# Patient Record
Sex: Male | Born: 1950 | Race: White | Hispanic: No | Marital: Married | Smoking: Former smoker
Health system: Southern US, Community
[De-identification: ages and names within clinical notes are randomized; demographics above are authoritative.]

## PROBLEM LIST (undated history)

## (undated) DIAGNOSIS — H9311 Tinnitus, right ear: Secondary | ICD-10-CM

## (undated) DIAGNOSIS — K219 Gastro-esophageal reflux disease without esophagitis: Secondary | ICD-10-CM

## (undated) DIAGNOSIS — D369 Benign neoplasm, unspecified site: Secondary | ICD-10-CM

## (undated) DIAGNOSIS — N529 Male erectile dysfunction, unspecified: Secondary | ICD-10-CM

## (undated) DIAGNOSIS — J310 Chronic rhinitis: Secondary | ICD-10-CM

## (undated) DIAGNOSIS — F329 Major depressive disorder, single episode, unspecified: Secondary | ICD-10-CM

## (undated) DIAGNOSIS — M5412 Radiculopathy, cervical region: Secondary | ICD-10-CM

## (undated) DIAGNOSIS — C61 Malignant neoplasm of prostate: Secondary | ICD-10-CM

## (undated) DIAGNOSIS — IMO0002 Reserved for concepts with insufficient information to code with codable children: Secondary | ICD-10-CM

## (undated) DIAGNOSIS — F419 Anxiety disorder, unspecified: Secondary | ICD-10-CM

## (undated) DIAGNOSIS — F32A Depression, unspecified: Secondary | ICD-10-CM

## (undated) DIAGNOSIS — K635 Polyp of colon: Secondary | ICD-10-CM

## (undated) HISTORY — DX: Benign neoplasm, unspecified site: D36.9

## (undated) HISTORY — DX: Reserved for concepts with insufficient information to code with codable children: IMO0002

## (undated) HISTORY — DX: Chronic rhinitis: J31.0

## (undated) HISTORY — DX: Radiculopathy, cervical region: M54.12

## (undated) HISTORY — DX: Male erectile dysfunction, unspecified: N52.9

## (undated) HISTORY — PX: OTHER SURGICAL HISTORY: SHX169

---

## 1999-09-26 ENCOUNTER — Ambulatory Visit (HOSPITAL_COMMUNITY): Admission: RE | Admit: 1999-09-26 | Discharge: 1999-09-26 | Payer: Self-pay | Admitting: Neurological Surgery

## 1999-09-26 ENCOUNTER — Encounter: Payer: Self-pay | Admitting: Neurological Surgery

## 1999-10-17 ENCOUNTER — Ambulatory Visit (HOSPITAL_COMMUNITY): Admission: RE | Admit: 1999-10-17 | Discharge: 1999-10-17 | Payer: Self-pay | Admitting: Neurological Surgery

## 1999-10-17 ENCOUNTER — Encounter: Payer: Self-pay | Admitting: Neurological Surgery

## 2003-11-09 DIAGNOSIS — D369 Benign neoplasm, unspecified site: Secondary | ICD-10-CM

## 2003-11-09 HISTORY — DX: Benign neoplasm, unspecified site: D36.9

## 2004-08-10 ENCOUNTER — Ambulatory Visit (HOSPITAL_COMMUNITY): Admission: RE | Admit: 2004-08-10 | Discharge: 2004-08-10 | Payer: Self-pay | Admitting: Gastroenterology

## 2004-08-10 ENCOUNTER — Encounter (INDEPENDENT_AMBULATORY_CARE_PROVIDER_SITE_OTHER): Payer: Self-pay | Admitting: *Deleted

## 2006-11-08 HISTORY — PX: OTHER SURGICAL HISTORY: SHX169

## 2009-11-08 DIAGNOSIS — D369 Benign neoplasm, unspecified site: Secondary | ICD-10-CM

## 2009-11-08 HISTORY — DX: Benign neoplasm, unspecified site: D36.9

## 2010-03-12 ENCOUNTER — Ambulatory Visit (HOSPITAL_COMMUNITY): Admission: RE | Admit: 2010-03-12 | Discharge: 2010-03-12 | Payer: Self-pay | Admitting: Gastroenterology

## 2011-03-26 NOTE — Op Note (Signed)
NAME:  Joseph Hudson, Joseph Hudson NO.:  [id number]   MEDICAL RECORD NO.:  [id number]          PATIENT TYPE:  AMB   LOCATION:  ENDO                         FACILITY:  [facility]   PHYSICIAN:  Danise Edge, M.D.   DATE OF BIRTH:  Nov 22, 1950   DATE OF PROCEDURE:  08/10/2004  DATE OF DISCHARGE:                                 OPERATIVE REPORT   INDICATIONS:  Mr. Lucion Dilger is a 60 year old male born September 25, 1951. Mr. Pop is scheduled to undergo his first screening colonoscopy  with polypectomy to prevent colon cancer.   ENDOSCOPIST:  Danise Edge, M.D.   PREMEDICATION:  Versed 7.5 mg, Demerol 50 mg.   PROCEDURE NOTE:  After obtaining informed consent, Mr. Walkowski was placed in  the left lateral decubitus position. I administered intravenous Demerol and  intravenous Versed to achieve conscious sedation for the procedure. The  patient's blood pressure, oxygen saturation, and cardiac rhythm were  monitored throughout the procedure and documented in the medical record.   Anal inspection and digital rectal exam were normal. The prostate was  nonnodular. The Olympus adjustable pediatric colonoscope was introduced into  the rectum and advanced to the cecum. Colonic preparation for the exam today  was excellent.   Rectum:  Normal.   Sigmoid colon and descending colon:  Normal.   Splenic flexure:  Normal.   Transverse colon:  Normal.   Hepatic flexure:  Normal.   Ascending colon:  In the proximal ascending colon, a 3-mm sessile polyp was  removed with electrocautery snare.   Cecum and ileocecal valve:  Normal.   ASSESSMENT:  A small polyp was removed from the proximal ascending colon  with the electrocautery snare and submitted for pathologic interpretation.      MJ/MEDQ  D:  08/10/2004  T:  08/10/2004  Job:  [id number]   cc:   Theressa Millard, M.D.  [address] [place]  [place] [address]  Fax: [phone]

## 2011-04-27 LAB — BASIC METABOLIC PANEL
BUN: 15 (ref 4–21)
Creatinine: 1.1 (ref 0.6–1.3)
Glucose: 79
Potassium: 4.1 (ref 3.4–5.3)
SODIUM: 138 (ref 137–147)

## 2011-04-27 LAB — HEPATIC FUNCTION PANEL
ALK PHOS: 51 (ref 25–125)
ALT: 18 (ref 10–40)
AST: 26 (ref 14–40)
BILIRUBIN, TOTAL: 0.4

## 2011-04-27 LAB — PSA: PSA: 5.08

## 2011-04-27 LAB — VITAMIN B12: Vitamin B-12: 398

## 2011-08-13 ENCOUNTER — Other Ambulatory Visit: Payer: Self-pay | Admitting: Urology

## 2011-08-13 ENCOUNTER — Ambulatory Visit (HOSPITAL_BASED_OUTPATIENT_CLINIC_OR_DEPARTMENT_OTHER)
Admission: RE | Admit: 2011-08-13 | Discharge: 2011-08-13 | Disposition: A | Discharge status: Home / Self Care | Payer: BC Managed Care – PPO | Source: Ambulatory Visit | Attending: Urology | Admitting: Urology

## 2011-08-13 DIAGNOSIS — Z01810 Encounter for preprocedural cardiovascular examination: Secondary | ICD-10-CM | POA: Insufficient documentation

## 2011-08-13 DIAGNOSIS — Z01812 Encounter for preprocedural laboratory examination: Secondary | ICD-10-CM | POA: Insufficient documentation

## 2011-08-13 DIAGNOSIS — C61 Malignant neoplasm of prostate: Secondary | ICD-10-CM

## 2011-08-13 DIAGNOSIS — R972 Elevated prostate specific antigen [PSA]: Secondary | ICD-10-CM | POA: Insufficient documentation

## 2011-08-13 DIAGNOSIS — K219 Gastro-esophageal reflux disease without esophagitis: Secondary | ICD-10-CM | POA: Insufficient documentation

## 2011-08-13 HISTORY — DX: Malignant neoplasm of prostate: C61

## 2011-08-13 LAB — POCT HEMOGLOBIN-HEMACUE: Hemoglobin: 15.2 g/dL (ref 13.0–17.0)

## 2011-08-17 NOTE — Op Note (Signed)
  NAMEMESHULEM, ONORATO NO.:  [id number]  MEDICAL RECORD NO.:  [id number]  LOCATION:                                 FACILITY:  PHYSICIAN:  Danae Chen, M.D.       DATE OF BIRTH:  DATE OF PROCEDURE:  08/13/2011 DATE OF DISCHARGE:                              OPERATIVE REPORT   PREOPERATIVE DIAGNOSIS:  Elevated prostate specific antigen.  POSTOPERATIVE DIAGNOSIS:  Elevated prostate specific antigen.  PROCEDURE DONE:  Ultrasound, prostate biopsy.  SURGEON:  Danae Chen, M.D.  ULTRASOUND TECHNOLOGIST:  Dorcas Mcmurray.  INDICATION:  The patient is a 60 year old male, who has an elevated PSA of 5.08.  His PSA was 1.46 in May 2011.  He is scheduled today for ultrasound prostate biopsy.  He preferred to have the procedure done under general anesthesia.  The patient was identified by his wristband and proper time-out was taken.  Under general anesthesia, he was prepped and draped and placed in the left lateral decubitus position.  The transducer was then inserted into the rectum.  An ultrasound of the prostate was done.  The seminal vesicles appear normal.  There are several calcifications in the mid gland.  The prostate volume is 26.58 mL.  The prostate width is 4.62 cm, the height is 2.73 cm and the length is 4.02 cm.  Two biopsies of the right base, two biopsies of the right main gland and two biopsies of the right apex were done.  Then two biopsies of the left base, left mid gland, and left apex were done.  The transducer was then removed.  There was no evidence of bleeding at the end of the procedure.  The patient tolerated the procedure well and left the OR in satisfactory condition to post anesthesia care unit.     Danae Chen, M.D.     MN/MEDQ  D:  08/13/2011  T:  08/13/2011  Job:  [id number]  cc:   Theressa Millard, M.D. Fax: [phone]  Electronically Signed by Lindaann Slough M.D. on 08/17/2011 10:08:26 AM

## 2011-12-23 ENCOUNTER — Other Ambulatory Visit: Payer: Self-pay | Admitting: Urology

## 2012-01-05 ENCOUNTER — Encounter (HOSPITAL_COMMUNITY): Payer: Self-pay | Admitting: Pharmacy Technician

## 2012-01-06 ENCOUNTER — Encounter (HOSPITAL_COMMUNITY): Payer: Self-pay

## 2012-01-06 ENCOUNTER — Ambulatory Visit (HOSPITAL_COMMUNITY)
Admission: RE | Admit: 2012-01-06 | Discharge: 2012-01-06 | Disposition: A | Discharge status: Home / Self Care | Payer: BC Managed Care – PPO | Source: Ambulatory Visit | Attending: Urology | Admitting: Urology

## 2012-01-06 ENCOUNTER — Encounter (HOSPITAL_COMMUNITY)
Admission: RE | Admit: 2012-01-06 | Discharge: 2012-01-06 | Disposition: A | Discharge status: Home / Self Care | Payer: BC Managed Care – PPO | Source: Ambulatory Visit | Attending: Urology | Admitting: Urology

## 2012-01-06 ENCOUNTER — Other Ambulatory Visit: Payer: Self-pay

## 2012-01-06 DIAGNOSIS — Z01810 Encounter for preprocedural cardiovascular examination: Secondary | ICD-10-CM | POA: Insufficient documentation

## 2012-01-06 DIAGNOSIS — C61 Malignant neoplasm of prostate: Secondary | ICD-10-CM | POA: Insufficient documentation

## 2012-01-06 DIAGNOSIS — Z79899 Other long term (current) drug therapy: Secondary | ICD-10-CM | POA: Insufficient documentation

## 2012-01-06 DIAGNOSIS — K219 Gastro-esophageal reflux disease without esophagitis: Secondary | ICD-10-CM | POA: Insufficient documentation

## 2012-01-06 DIAGNOSIS — Z01818 Encounter for other preprocedural examination: Secondary | ICD-10-CM | POA: Insufficient documentation

## 2012-01-06 DIAGNOSIS — Z01812 Encounter for preprocedural laboratory examination: Secondary | ICD-10-CM | POA: Insufficient documentation

## 2012-01-06 HISTORY — DX: Malignant neoplasm of prostate: C61

## 2012-01-06 HISTORY — DX: Tinnitus, right ear: H93.11

## 2012-01-06 HISTORY — DX: Polyp of colon: K63.5

## 2012-01-06 HISTORY — DX: Gastro-esophageal reflux disease without esophagitis: K21.9

## 2012-01-06 HISTORY — DX: Anxiety disorder, unspecified: F41.9

## 2012-01-06 LAB — CBC
HCT: 43 % (ref 39.0–52.0)
Hemoglobin: 14.7 g/dL (ref 13.0–17.0)
MCH: 32.4 pg (ref 26.0–34.0)
MCHC: 34.2 g/dL (ref 30.0–36.0)
MCV: 94.7 fL (ref 78.0–100.0)
RBC: 4.54 MIL/uL (ref 4.22–5.81)

## 2012-01-06 LAB — BASIC METABOLIC PANEL
BUN: 15 mg/dL (ref 6–23)
CO2: 30 mEq/L (ref 19–32)
Calcium: 9.7 mg/dL (ref 8.4–10.5)
Creatinine, Ser: 1.02 mg/dL (ref 0.50–1.35)
GFR calc non Af Amer: 79 mL/min — ABNORMAL LOW (ref >90)
Glucose, Bld: 81 mg/dL (ref 70–99)
Sodium: 137 mEq/L (ref 135–145)

## 2012-01-06 LAB — SURGICAL PCR SCREEN: MRSA, PCR: NEGATIVE

## 2012-01-06 NOTE — Patient Instructions (Signed)
[name]  01/06/2012   Your procedure is scheduled on:  01-13-12  Report to [facility] at  0830 AM.  Call this number if you have problems the morning of surgery: [phone]   Remember:   Clear liquids day before surgery and bowel prep per dr Laverle Patter instructions.  .  Take these medicines the morning of surgery with A SIP OF WATER: astelin nasal spray, pantaprazole, flucitasone nasal spray, certizine   Do not wear jewelry or make up.  Do not wear lotions, powders, or perfumes.Do not wear deodorant.    Do not bring valuables to the hospital.  Contacts, dentures or bridgework may not be worn into surgery.  Leave suitcase in the car. After surgery it may be brought to your room.  For patients admitted to the hospital, checkout time is 11:00 AM the day of discharge.     Special Instructions: CHG Shower Use Special Wash: 1/2 bottle night before surgery and 1/2 bottle morning of surgery.neck down avoid private area   Please read over the following fact sheets that you were given: MRSA Information, blood fact sheet  [facility] pre op nurse phone number [phone], call if needed

## 2012-01-06 NOTE — Pre-Procedure Instructions (Signed)
Spoke with sonia mcdougle will make dr borden aware of chest xray results done 01-06-2012

## 2012-01-07 ENCOUNTER — Ambulatory Visit (HOSPITAL_COMMUNITY)
Admission: RE | Admit: 2012-01-07 | Discharge: 2012-01-07 | Disposition: A | Discharge status: Home / Self Care | Payer: BC Managed Care – PPO | Source: Ambulatory Visit | Attending: Urology | Admitting: Urology

## 2012-01-07 ENCOUNTER — Other Ambulatory Visit (HOSPITAL_COMMUNITY): Payer: Self-pay | Admitting: Urology

## 2012-01-07 DIAGNOSIS — R911 Solitary pulmonary nodule: Secondary | ICD-10-CM

## 2012-01-12 NOTE — H&P (Signed)
Chief Complaint  Prostate Cancer     History of Present Illness     [name] is a 87 year who was found to have an elevated PSA of 5.08 prompting urologic evaluation by Dr. Brunilda Payor.  He was found to have an indurated prostate all along the left lobe of the prostate.  He underwent a prostate biopsy in the OR on 08/13/11 which demonstrated 5 out of 12 biopsy cores positive for Gleason 3+3=6 adenocarcinoma. Unfortunately, his pathology report does not specify the location of the positive cores. He has no family history of prostate cancer.  He is well informed about his treatment options.  TNM stage: cT2b Nx Mx PSA: 5.08 Gleason score: 3+3=6 Biopsy (08/13/11, read by Dr. Italy Rund, [facility], Acc # [id number]): 5/12 cores positive I did call [facility] pathology today and confirmed that all of his biopsy cores were positive were located on the left side of the prostate with 60% involvement of the left lateral apex, left apex, and left lateral mid. 1% of the left mid. 25% of the left lateral base. Prostate volume: 26.6 cc  Nomogram: OC disease: 71% EPE: 36% SVI: 3% LNI: 2.6% PFS (surgery): 95%, 92%  Urinary function: He does have moderate lower urinary tract symptoms. His most significant symptoms include incomplete emptying and a weak stream. IPSS: 14/4.  Erectile function: He denies erectile dysfunction. SHIM score: 25   Past Medical History Problems  1. History of  Allergic Rhinitis 477.9 2. History of  Anxiety (Symptom) 300.00 3. History of  Heartburn 787.1 4. History of  Peptic Ulcer V12.71  Surgical History Problems  1. History of  Biopsy Of The Prostate Needle 2. History of  Shoulder Surgery Right  Current Meds 1. Multi-Vitamin TABS; Therapy: (Recorded:29Aug2012) to 2. Nasonex SUSP; Therapy: (Recorded:29Aug2012) to 3. Protonix 20 MG Oral Tablet Delayed Release; Therapy: (Recorded:29Aug2012) to 4. ZyrTEC Allergy TABS; Therapy: (Recorded:29Aug2012)  to  Allergies Medication  1. No Known Drug Allergies  Family History Problems  1. Family history of  Heart Disease V17.49 Denied  2. Family history of  Prostate Cancer  Social History Problems    Alcohol Use 2   Marital History - Currently Married   Tobacco Use 305.1 smoked 1 1/2 pk for 25 yrs / quit18 yrs  Review of Systems Constitutional, skin, eye, otolaryngeal, hematologic/lymphatic, cardiovascular, pulmonary, endocrine, musculoskeletal, gastrointestinal, neurological and psychiatric system(s) were reviewed and pertinent findings if present are noted.    Vitals  BMI Calculated: 25.43 BSA Calculated: 1.85 Height: 5 ft 7 in Weight: 162 lb    Physical Exam Constitutional: Well nourished and well developed . No acute distress.  ENT:. The ears and nose are normal in appearance.  Neck: The appearance of the neck is normal and no neck mass is present.  Pulmonary: No respiratory distress, normal respiratory rhythm and effort and clear bilateral breath sounds.  Cardiovascular: Heart rate and rhythm are normal . No peripheral edema.  Abdomen: The abdomen is soft and nontender. No masses are palpated. No CVA tenderness. No hernias are palpable. No hepatosplenomegaly noted.  Rectal: Rectal exam demonstrates normal sphincter tone, no tenderness and no masses. Prostate size is estimated to be 35 g. He does have significant induration all along the lateral aspect of the left side of the prostate which is concerning for malignancy and possible but not definite extraprostatic extension. The prostate is not tender. The left seminal vesicle is nonpalpable. The right seminal vesicle is nonpalpable. The perineum is normal on inspection.  Lymphatics: The femoral and inguinal nodes are not enlarged or tender.  Skin: Normal skin turgor, no visible rash and no visible skin lesions.  Neuro/Psych:. Mood and affect are appropriate.    Results/Data  I have independently reviewed his medical  records, PSA results, and pathology report.     Assessment Assessed  1. Prostate Cancer 185  Discussion/Summary  1. Prostate cancer: My plan is to perform a right nerve sparing robotic prostatectomy and pelvic lymphadenectomy considering the concern regarding the disease on the left side of the prostate.

## 2012-01-13 ENCOUNTER — Inpatient Hospital Stay (HOSPITAL_COMMUNITY)
Admission: RE | Admit: 2012-01-13 | Discharge: 2012-01-14 | DRG: 335 | Disposition: A | Discharge status: Home / Self Care | Payer: BC Managed Care – PPO | Source: Ambulatory Visit | Attending: Urology | Admitting: Urology

## 2012-01-13 ENCOUNTER — Encounter (HOSPITAL_COMMUNITY): Payer: Self-pay | Admitting: *Deleted

## 2012-01-13 ENCOUNTER — Ambulatory Visit (HOSPITAL_COMMUNITY): Payer: BC Managed Care – PPO | Admitting: Anesthesiology

## 2012-01-13 ENCOUNTER — Encounter (HOSPITAL_COMMUNITY): Payer: Self-pay | Admitting: Anesthesiology

## 2012-01-13 ENCOUNTER — Encounter (HOSPITAL_COMMUNITY)
Admission: RE | Disposition: A | Discharge status: Home / Self Care | Payer: Self-pay | Source: Ambulatory Visit | Attending: Urology

## 2012-01-13 DIAGNOSIS — J309 Allergic rhinitis, unspecified: Secondary | ICD-10-CM | POA: Diagnosis present

## 2012-01-13 DIAGNOSIS — Z87891 Personal history of nicotine dependence: Secondary | ICD-10-CM

## 2012-01-13 DIAGNOSIS — C61 Malignant neoplasm of prostate: Principal | ICD-10-CM | POA: Diagnosis present

## 2012-01-13 DIAGNOSIS — Z8711 Personal history of peptic ulcer disease: Secondary | ICD-10-CM

## 2012-01-13 HISTORY — PX: ROBOT ASSISTED LAPAROSCOPIC RADICAL PROSTATECTOMY: SHX5141

## 2012-01-13 LAB — TYPE AND SCREEN: Antibody Screen: NEGATIVE

## 2012-01-13 LAB — HEMOGLOBIN AND HEMATOCRIT, BLOOD
HCT: 41.3 % (ref 39.0–52.0)
Hemoglobin: 14 g/dL (ref 13.0–17.0)

## 2012-01-13 SURGERY — ROBOTIC ASSISTED LAPAROSCOPIC RADICAL PROSTATECTOMY LEVEL 2
Anesthesia: General | Site: Abdomen | Wound class: Clean Contaminated

## 2012-01-13 MED ORDER — ACETAMINOPHEN 10 MG/ML IV SOLN
1000.0000 mg | Freq: Four times a day (QID) | INTRAVENOUS | Status: DC
  Administered 2012-01-13 – 2012-01-14 (×3): 1000 mg via INTRAVENOUS
  Filled 2012-01-13 (×4): qty 100

## 2012-01-13 MED ORDER — CIPROFLOXACIN HCL 500 MG PO TABS: 500.0000 mg | ORAL_TABLET | Freq: Two times a day (BID) | ORAL | Status: AC

## 2012-01-13 MED ORDER — MEPERIDINE HCL 50 MG/ML IJ SOLN
INTRAMUSCULAR | Status: AC
  Filled 2012-01-13: qty 1

## 2012-01-13 MED ORDER — BUPIVACAINE-EPINEPHRINE 0.25% -1:200000 IJ SOLN
INTRAMUSCULAR | Status: DC | PRN
  Administered 2012-01-13: 27 mL

## 2012-01-13 MED ORDER — HYDROCODONE-ACETAMINOPHEN 5-325 MG PO TABS: 1.0000 | ORAL_TABLET | Freq: Four times a day (QID) | ORAL | Status: AC | PRN

## 2012-01-13 MED ORDER — SUFENTANIL CITRATE 50 MCG/ML IV SOLN
INTRAVENOUS | Status: DC | PRN
  Administered 2012-01-13: 5 ug via INTRAVENOUS
  Administered 2012-01-13 (×4): 10 ug via INTRAVENOUS
  Administered 2012-01-13: 15 ug via INTRAVENOUS
  Administered 2012-01-13: 10 ug via INTRAVENOUS

## 2012-01-13 MED ORDER — MORPHINE SULFATE 2 MG/ML IJ SOLN
2.0000 mg | INTRAMUSCULAR | Status: DC | PRN
  Administered 2012-01-13 – 2012-01-14 (×2): 2 mg via INTRAVENOUS
  Filled 2012-01-13 (×2): qty 1

## 2012-01-13 MED ORDER — LACTATED RINGERS IV SOLN: INTRAVENOUS | Status: DC

## 2012-01-13 MED ORDER — DIPHENHYDRAMINE HCL 50 MG/ML IJ SOLN: 12.5000 mg | Freq: Four times a day (QID) | INTRAMUSCULAR | Status: DC | PRN

## 2012-01-13 MED ORDER — PROMETHAZINE HCL 25 MG/ML IJ SOLN: 6.2500 mg | INTRAMUSCULAR | Status: DC | PRN

## 2012-01-13 MED ORDER — CEFAZOLIN SODIUM 1-5 GM-% IV SOLN
1.0000 g | Freq: Three times a day (TID) | INTRAVENOUS | Status: AC
  Administered 2012-01-13 – 2012-01-14 (×2): 1 g via INTRAVENOUS
  Filled 2012-01-13 (×2): qty 50

## 2012-01-13 MED ORDER — SUCCINYLCHOLINE CHLORIDE 20 MG/ML IJ SOLN
INTRAMUSCULAR | Status: DC | PRN
  Administered 2012-01-13: 80 mg via INTRAVENOUS

## 2012-01-13 MED ORDER — INDIGOTINDISULFONATE SODIUM 8 MG/ML IJ SOLN
INTRAMUSCULAR | Status: DC | PRN
  Administered 2012-01-13 (×2): 5 mL via INTRAVENOUS

## 2012-01-13 MED ORDER — DOCUSATE SODIUM 100 MG PO CAPS
100.0000 mg | ORAL_CAPSULE | Freq: Two times a day (BID) | ORAL | Status: DC
  Administered 2012-01-13 – 2012-01-14 (×2): 100 mg via ORAL
  Filled 2012-01-13 (×3): qty 1

## 2012-01-13 MED ORDER — ACETAMINOPHEN 10 MG/ML IV SOLN
INTRAVENOUS | Status: AC
  Filled 2012-01-13: qty 100

## 2012-01-13 MED ORDER — BUPIVACAINE-EPINEPHRINE PF 0.25-1:200000 % IJ SOLN
INTRAMUSCULAR | Status: AC
  Filled 2012-01-13: qty 30

## 2012-01-13 MED ORDER — ACETAMINOPHEN 10 MG/ML IV SOLN
INTRAVENOUS | Status: DC | PRN
  Administered 2012-01-13: 1000 mg via INTRAVENOUS

## 2012-01-13 MED ORDER — DIPHENHYDRAMINE HCL 12.5 MG/5ML PO ELIX: 12.5000 mg | ORAL_SOLUTION | Freq: Four times a day (QID) | ORAL | Status: DC | PRN

## 2012-01-13 MED ORDER — NEOSTIGMINE METHYLSULFATE 1 MG/ML IJ SOLN
INTRAMUSCULAR | Status: DC | PRN
  Administered 2012-01-13: 4 mg via INTRAVENOUS

## 2012-01-13 MED ORDER — ONDANSETRON HCL 4 MG/2ML IJ SOLN
INTRAMUSCULAR | Status: DC | PRN
  Administered 2012-01-13: 4 mg via INTRAVENOUS

## 2012-01-13 MED ORDER — STERILE WATER FOR IRRIGATION IR SOLN
Status: DC | PRN
  Administered 2012-01-13: 3000 mL

## 2012-01-13 MED ORDER — LACTATED RINGERS IV SOLN
INTRAVENOUS | Status: DC | PRN
  Administered 2012-01-13: 12:00:00

## 2012-01-13 MED ORDER — FLUTICASONE PROPIONATE 50 MCG/ACT NA SUSP
1.0000 | Freq: Every day | NASAL | Status: DC
  Administered 2012-01-13 – 2012-01-14 (×2): 1 via NASAL
  Filled 2012-01-13: qty 16

## 2012-01-13 MED ORDER — INDIGOTINDISULFONATE SODIUM 8 MG/ML IJ SOLN
INTRAMUSCULAR | Status: AC
  Filled 2012-01-13: qty 10

## 2012-01-13 MED ORDER — PROPOFOL 10 MG/ML IV EMUL
INTRAVENOUS | Status: DC | PRN
  Administered 2012-01-13: 150 mg via INTRAVENOUS

## 2012-01-13 MED ORDER — SODIUM CHLORIDE 0.9 % IR SOLN
Status: DC | PRN
  Administered 2012-01-13: 250 mL

## 2012-01-13 MED ORDER — DEXAMETHASONE SODIUM PHOSPHATE 4 MG/ML IJ SOLN
INTRAMUSCULAR | Status: DC | PRN
  Administered 2012-01-13: 8 mg via INTRAVENOUS

## 2012-01-13 MED ORDER — KCL IN DEXTROSE-NACL 20-5-0.45 MEQ/L-%-% IV SOLN
INTRAVENOUS | Status: DC
  Administered 2012-01-13: 18:00:00 via INTRAVENOUS
  Filled 2012-01-13 (×5): qty 1000

## 2012-01-13 MED ORDER — GLYCOPYRROLATE 0.2 MG/ML IJ SOLN
INTRAMUSCULAR | Status: DC | PRN
  Administered 2012-01-13: .6 mg via INTRAVENOUS

## 2012-01-13 MED ORDER — SODIUM CHLORIDE 0.9 % IV BOLUS (SEPSIS)
1000.0000 mL | Freq: Once | INTRAVENOUS | Status: AC
  Administered 2012-01-13: 1000 mL via INTRAVENOUS

## 2012-01-13 MED ORDER — CEFAZOLIN SODIUM 1-5 GM-% IV SOLN
INTRAVENOUS | Status: AC
  Filled 2012-01-13: qty 50

## 2012-01-13 MED ORDER — HYDROMORPHONE HCL PF 1 MG/ML IJ SOLN: 0.2500 mg | INTRAMUSCULAR | Status: DC | PRN

## 2012-01-13 MED ORDER — PANTOPRAZOLE SODIUM 40 MG PO TBEC
40.0000 mg | DELAYED_RELEASE_TABLET | Freq: Every morning | ORAL | Status: DC
  Filled 2012-01-13: qty 1

## 2012-01-13 MED ORDER — AZELASTINE HCL 0.1 % NA SOLN
1.0000 | Freq: Two times a day (BID) | NASAL | Status: DC
  Administered 2012-01-13 – 2012-01-14 (×2): 1 via NASAL
  Filled 2012-01-13: qty 30

## 2012-01-13 MED ORDER — MIDAZOLAM HCL 5 MG/5ML IJ SOLN
INTRAMUSCULAR | Status: DC | PRN
  Administered 2012-01-13: 2 mg via INTRAVENOUS

## 2012-01-13 MED ORDER — LACTATED RINGERS IV SOLN
INTRAVENOUS | Status: DC | PRN
  Administered 2012-01-13 (×3): via INTRAVENOUS

## 2012-01-13 MED ORDER — HEPARIN SODIUM (PORCINE) 1000 UNIT/ML IJ SOLN
INTRAMUSCULAR | Status: AC
  Filled 2012-01-13: qty 1

## 2012-01-13 MED ORDER — CEFAZOLIN SODIUM 1-5 GM-% IV SOLN
1.0000 g | INTRAVENOUS | Status: AC
  Administered 2012-01-13: 1 g via INTRAVENOUS

## 2012-01-13 MED ORDER — CISATRACURIUM BESYLATE 2 MG/ML IV SOLN
INTRAVENOUS | Status: DC | PRN
  Administered 2012-01-13: 10 mg via INTRAVENOUS
  Administered 2012-01-13 (×2): 4 mg via INTRAVENOUS

## 2012-01-13 MED ORDER — MEPERIDINE HCL 50 MG/ML IJ SOLN
6.2500 mg | INTRAMUSCULAR | Status: DC | PRN
  Administered 2012-01-13: 12.5 mg via INTRAVENOUS

## 2012-01-13 MED ORDER — LIDOCAINE HCL (CARDIAC) 20 MG/ML IV SOLN
INTRAVENOUS | Status: DC | PRN
  Administered 2012-01-13: 100 mg via INTRAVENOUS

## 2012-01-13 SURGICAL SUPPLY — 37 items
CANISTER SUCTION 2500CC (MISCELLANEOUS) ×3 IMPLANT
CATH ROBINSON RED A/P 8FR (CATHETERS) ×3 IMPLANT
CHLORAPREP W/TINT 26ML (MISCELLANEOUS) ×6 IMPLANT
CLIP LIGATING HEM O LOK PURPLE (MISCELLANEOUS) ×6 IMPLANT
CLOTH BEACON ORANGE TIMEOUT ST (SAFETY) ×3 IMPLANT
CORD HIGH FREQUENCY UNIPOLAR (ELECTROSURGICAL) ×3 IMPLANT
COVER SURGICAL LIGHT HANDLE (MISCELLANEOUS) ×3 IMPLANT
COVER TIP SHEARS 8 DVNC (MISCELLANEOUS) ×2 IMPLANT
COVER TIP SHEARS 8MM DA VINCI (MISCELLANEOUS) ×1
CUTTER ECHEON FLEX ENDO 45 340 (ENDOMECHANICALS) ×3 IMPLANT
DECANTER SPIKE VIAL GLASS SM (MISCELLANEOUS) IMPLANT
DRAPE SURG IRRIG POUCH 19X23 (DRAPES) ×3 IMPLANT
DRSG TEGADERM 2-3/8X2-3/4 SM (GAUZE/BANDAGES/DRESSINGS) ×12 IMPLANT
DRSG TEGADERM 4X4.75 (GAUZE/BANDAGES/DRESSINGS) ×3 IMPLANT
DRSG TEGADERM 6X8 (GAUZE/BANDAGES/DRESSINGS) IMPLANT
ELECT REM PT RETURN 9FT ADLT (ELECTROSURGICAL) ×3
ELECTRODE REM PT RTRN 9FT ADLT (ELECTROSURGICAL) ×2 IMPLANT
GLOVE BIO SURGEON STRL SZ 6.5 (GLOVE) ×3 IMPLANT
GLOVE BIOGEL M STRL SZ7.5 (GLOVE) ×6 IMPLANT
GOWN STRL NON-REIN LRG LVL3 (GOWN DISPOSABLE) ×9 IMPLANT
GOWN STRL REIN XL XLG (GOWN DISPOSABLE) ×6 IMPLANT
HOLDER FOLEY CATH W/STRAP (MISCELLANEOUS) ×3 IMPLANT
IV LACTATED RINGERS 1000ML (IV SOLUTION) ×3 IMPLANT
KIT ACCESSORY DA VINCI DISP (KITS) ×1
KIT ACCESSORY DVNC DISP (KITS) ×2 IMPLANT
NDL SAFETY ECLIPSE 18X1.5 (NEEDLE) ×2 IMPLANT
NEEDLE HYPO 18GX1.5 SHARP (NEEDLE) ×1
PACK ROBOT UROLOGY CUSTOM (CUSTOM PROCEDURE TRAY) ×3 IMPLANT
RELOAD GREEN ECHELON 45 (STAPLE) ×3 IMPLANT
SET TUBE IRRIG SUCTION NO TIP (IRRIGATION / IRRIGATOR) ×3 IMPLANT
SOLUTION ELECTROLUBE (MISCELLANEOUS) ×3 IMPLANT
SPONGE GAUZE 4X4 12PLY (GAUZE/BANDAGES/DRESSINGS) ×3 IMPLANT
SUT VICRYL 0 UR6 27IN ABS (SUTURE) ×9 IMPLANT
SYR 27GX1/2 1ML LL SAFETY (SYRINGE) ×3 IMPLANT
TOWEL OR 17X26 10 PK STRL BLUE (TOWEL DISPOSABLE) ×3 IMPLANT
TOWEL OR NON WOVEN STRL DISP B (DISPOSABLE) ×3 IMPLANT
WATER STERILE IRR 1500ML POUR (IV SOLUTION) ×6 IMPLANT

## 2012-01-13 NOTE — Discharge Instructions (Signed)
1. Activity:  You are encouraged to ambulate frequently (about every hour during waking hours) to help prevent blood clots from forming in your legs or lungs.  However, you should not engage in any heavy lifting (> 10-15 lbs), strenuous activity, or straining. °2. Diet: You should continue a clear liquid diet until passing gas from below.  Once this occurs, you may advance your diet to a soft diet that would be easy to digest (i.e soups, scrambled eggs, mashed potatoes, etc.) for 24 hours just as you would if getting over a bad stomach flu.  If tolerating this diet well for 24 hours, you may then begin eating regular food.  It will be normal to have some amount of bloating, nausea, and abdominal discomfort intermittently. °3. Prescriptions:  You will be provided a prescription for pain medication to take as needed.  If your pain is not severe enough to require the prescription pain medication, you may take extra strength Tylenol instead.  You should also take an over the counter stool softener (Colace 100 mg twice daily) to avoid straining with bowel movements as the pain medication may constipate you. Finally, you will also be provided a prescription for an antibiotic to begin the day prior to your return visit in the office for catheter removal. °4. Catheter care: You will be taught how to take care of the catheter by the nursing staff prior to discharge from the hospital.  You may use both a leg bag and the larger bedside bag but it is recommended to at least use the bigger bedside bag at nighttime as the leg bag is small and will fill up overnight and also does not drain as well when lying flat. You may periodically feel a strong urge to void with the catheter in place.  This is a bladder spasm and most often can occur when having a bowel movement or when you are moving around. It is typically self-limited and usually will stop after a few minutes.  You may use some Vaseline or Neosporin around the tip of the  catheter to reduce friction at the tip of the penis. °5. Incisions: You may remove your dressing bandages the 2nd day after surgery.  You most likely will have a few small staples in each of the incisions and once the bandages are removed, the incisions may stay open to air.  You may start showering (not soaking or bathing in water) 48 hours after surgery and the incisions simply need to be patted dry after the shower.  No additional care is needed. °6. What to call us about: You should call the office ([phone]) if you develop fever > 101, persistent vomiting, or the catheter stops draining. Also, feel free to call with any other questions you may have and remember the handout that was provided to you as a reference preoperatively which answers many of the common questions that arise after surgery. ° °You may resume aspirin and vitamins 7 days after surgery. °

## 2012-01-13 NOTE — Progress Notes (Signed)
Patient ID: DAL BLEW, male   DOB: 1951/06/06, 61 y.o.   MRN: [id number]  Post-op note  Subjective: The patient is doing well.  No complaints.  Objective: Vital signs in last 24 hours: Temp:  [97.6 F (36.4 C)-98.4 F (36.9 C)] 98.4 F (36.9 C) (03/07 1458) Pulse Rate:  [62-103] 67  (03/07 1458) Resp:  [10-20] 14  (03/07 1458) BP: (113-169)/(65-95) 113/65 mmHg (03/07 1458) SpO2:  [98 %-100 %] 100 % (03/07 1458) Weight:  [73.936 kg (163 lb)] 73.936 kg (163 lb) (03/07 1458)  Intake/Output from previous day:   Intake/Output this shift: Total I/O In: 4367.5 [P.O.:120; I.V.:3067.5; Other:30; IV Piggyback:1150] Out: 225 [Urine:100; Blood:125]  Physical Exam:  General: Alert and oriented. Abdomen: Soft, Nondistended. Incisions: Clean and dry.  Lab Results:  Basename 01/13/12 1404  HGB 14.0  HCT 41.3    Assessment/Plan: POD#0   1) Continue to monitor   Joseph Hudson. MD   LOS: 0 days   Joseph Hudson,LES 01/13/2012, 6:23 PM

## 2012-01-13 NOTE — Transfer of Care (Signed)
Immediate Anesthesia Transfer of Care Note  Patient: Joseph Hudson  Procedure(s) Performed: Procedure(s) (LRB): ROBOTIC ASSISTED LAPAROSCOPIC RADICAL PROSTATECTOMY LEVEL 2 (N/A) LYMPHADENECTOMY (Bilateral)  Patient Location: PACU  Anesthesia Type: General  Level of Consciousness: awake, alert  and oriented  Airway & Oxygen Therapy: Patient Spontanous Breathing and Patient connected to face mask oxygen  Post-op Assessment: Report given to PACU RN and Post -op Vital signs reviewed and stable  Post vital signs: Reviewed and stable  Complications: No apparent anesthesia complications

## 2012-01-13 NOTE — Op Note (Signed)
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 Robotic assisted laparoscopic radical prostatectomy (right nerve sparing) 2. Bilateral robotic assisted laparoscopic pelvic lymphadenectomy  Surgeon: Moody Bruins. M.D.  Assistant(s): Pecola Leisure, PA-C  Anesthesia: General  Complications: None  EBL: 100 mL  IVF:  1500 mL crystalloid  Specimens: 1. Prostate and seminal vesicles 2. Right pelvic lymph nodes 3. Left pelvic lymph nodes  Disposition of specimens: Pathology  Drains: 1. 20 Fr coude catheter 2. # 19 Blake pelvic drain  Indication: Joseph Hudson is a 61 y.o. patient with clinically localized prostate cancer.  After a thorough review of the management options for treatment of prostate cancer, he elected to proceed with surgical therapy and the above procedure(s).  We have discussed the potential benefits and risks of the procedure, side effects of the proposed treatment, the likelihood of the patient achieving the goals of the procedure, and any potential problems that might occur during the procedure or recuperation. Informed consent has been obtained.  Description of procedure:  The patient was taken to the operating room and a general anesthetic was administered. He was given preoperative antibiotics, placed in the dorsal lithotomy position, and prepped and draped in the usual sterile fashion. Next a preoperative timeout was performed. A urethral catheter was placed into the bladder and a site was selected near the umbilicus for placement of the camera port. This was placed using a standard open Hassan technique which allowed entry into the peritoneal cavity under direct vision and without difficulty. A 12 mm port was placed and a pneumoperitoneum established. The camera was then used  to inspect the abdomen and there was no evidence of any intra-abdominal injuries or other abnormalities. The remaining abdominal ports were then placed. 8 mm robotic ports were placed in the right lower quadrant, left lower quadrant, and far left lateral abdominal wall. A 5 mm port was placed in the right upper quadrant and a 12 mm port was placed in the right lateral abdominal wall for laparoscopic assistance. All ports were placed under direct vision without difficulty. The surgical cart was then docked.   Utilizing the cautery scissors, the bladder was reflected posteriorly allowing entry into the space of Retzius and identification of the endopelvic fascia and prostate. The periprostatic fat was then removed from the prostate allowing full exposure of the endopelvic fascia. The endopelvic fascia was then incised from the apex back to the base of the prostate bilaterally and the underlying levator muscle fibers were swept laterally off the prostate thereby isolating the dorsal venous complex. The dorsal vein was then stapled and divided with a 45 mm Flex Echelon stapler. Attention then turned to the bladder neck which was divided anteriorly thereby allowing entry into the bladder and exposure of the urethral catheter. The catheter balloon was deflated and the catheter was brought into the operative field and used to retract the prostate anteriorly. The posterior bladder neck was then examined and was divided allowing further dissection between the bladder and prostate posteriorly until the vasa deferentia and seminal vessels were identified. The vasa deferentia were isolated, divided, and lifted anteriorly. The seminal vesicles were dissected down to their tips with care to control the seminal vascular arterial blood supply. These structures were then lifted anteriorly and the space between Denonvillier's fascia and the anterior rectum was developed with a combination of sharp  and blunt dissection. This  isolated the vascular pedicles of the prostate.  The lateral prostatic fascia on the right side of the prostate was then sharply incised allowing release of the neurovascular bundle. The vascular pedicle of the prostate on the right side was then ligated with Weck clips between the prostate and neurovascular bundle and divided with sharp cold scissor dissection resulting in neurovascular bundle preservation. On the left side, a wide non nerve sparing dissection was performed with Weck clips used to ligate the vascular pedicle of the prostate. The neurovascular bundle on the right side was then separated off the apex of the prostate and urethra.  The urethra was then sharply transected allowing the prostate specimen to be disarticulated. The pelvis was copiously irrigated and hemostasis was ensured. There was no evidence for rectal injury.  Attention then turned to the right pelvic sidewall. The fibrofatty tissue between the external iliac vein, confluence of the iliac vessels, hypogastric artery, and Cooper's ligament was dissected free from the pelvic sidewall with care to preserve the obturator nerve. Weck clips were used for lymphostasis and hemostasis. An identical procedure was performed on the contralateral side and the lymphatic packets were removed for permanent pathologic analysis.  Attention then turned to the urethral anastomosis. A 2-0 Vicryl slip knot was placed between Denonvillier's fascia, the posterior bladder neck, and the posterior urethra to reapproximate these structures. A double-armed 3-0 Monocryl suture was then used to perform a 360 running tension-free anastomosis between the bladder neck and urethra. A new urethral catheter was then placed into the bladder and irrigated. There were no blood clots within the bladder and the anastomosis appeared to be watertight. A #19 Blake drain was then brought through the left lateral 8 mm port site and positioned appropriately within the  pelvis. It was secured to the skin with a nylon suture. The surgical cart was then undocked. The right lateral 12 mm port site was closed at the fascial level with a 0 Vicryl suture placed laparoscopically. All remaining ports were then removed under direct vision. The prostate specimen was removed intact within the Endopouch retrieval bag via the periumbilical camera port site. This fascial opening was closed with two running 0 Vicryl sutures. 0.25% Marcaine was then injected into all port sites and all incisions were reapproximated at the skin level with staples. Sterile dressings were applied. The patient appeared to tolerate the procedure well and without complications. The patient was able to be extubated and transferred to the recovery unit in satisfactory condition.   Moody Bruins MD

## 2012-01-13 NOTE — Anesthesia Preprocedure Evaluation (Signed)
Anesthesia Evaluation  Patient identified by MRN, date of birth, ID band Patient awake    Reviewed: Allergy & Precautions, H&P , NPO status , Patient's Chart, lab work & pertinent test results  Airway Mallampati: II TM Distance: >3 FB Neck ROM: Full    Dental No notable dental hx.    Pulmonary neg pulmonary ROS,  breath sounds clear to auscultation  Pulmonary exam normal       Cardiovascular negative cardio ROS  Rhythm:Regular Rate:Normal     Neuro/Psych PSYCHIATRIC DISORDERS Anxiety negative neurological ROS  negative psych ROS   GI/Hepatic negative GI ROS, Neg liver ROS, GERD-  Medicated and Controlled,  Endo/Other  negative endocrine ROS  Renal/GU negative Renal ROS  negative genitourinary   Musculoskeletal negative musculoskeletal ROS (+)   Abdominal   Peds negative pediatric ROS (+)  Hematology negative hematology ROS (+)   Anesthesia Other Findings   Reproductive/Obstetrics negative OB ROS                           Anesthesia Physical Anesthesia Plan  ASA: II  Anesthesia Plan: General   Post-op Pain Management:    Induction: Intravenous  Airway Management Planned: Oral ETT  Additional Equipment:   Intra-op Plan:   Post-operative Plan: Extubation in OR  Informed Consent: I have reviewed the patients History and Physical, chart, labs and discussed the procedure including the risks, benefits and alternatives for the proposed anesthesia with the patient or authorized representative who has indicated his/her understanding and acceptance.   Dental advisory given  Plan Discussed with: CRNA  Anesthesia Plan Comments:         Anesthesia Quick Evaluation

## 2012-01-13 NOTE — Anesthesia Postprocedure Evaluation (Signed)
  Anesthesia Post-op Note  Patient: Joseph Hudson  Procedure(s) Performed: Procedure(s) (LRB): ROBOTIC ASSISTED LAPAROSCOPIC RADICAL PROSTATECTOMY LEVEL 2 (N/A) LYMPHADENECTOMY (Bilateral)  Patient Location: PACU  Anesthesia Type: General  Level of Consciousness: awake and alert   Airway and Oxygen Therapy: Patient Spontanous Breathing  Post-op Pain: mild  Post-op Assessment: Post-op Vital signs reviewed, Patient's Cardiovascular Status Stable, Respiratory Function Stable, Patent Airway and No signs of Nausea or vomiting  Post-op Vital Signs: stable  Complications: No apparent anesthesia complications

## 2012-01-13 NOTE — Anesthesia Procedure Notes (Signed)
Date/Time: 01/13/2012 11:17 AM Performed by: Lurlean Leyden, Tonita Bills L. Patient Re-evaluated:Patient Re-evaluated prior to inductionOxygen Delivery Method: Circle system utilized Preoxygenation: Pre-oxygenation with 100% oxygen Intubation Type: IV induction Ventilation: Mask ventilation without difficulty and Oral airway inserted - appropriate to patient size Laryngoscope Size: Miller and 3 Grade View: Grade III Tube size: 8.0 mm Number of attempts: 1 Airway Equipment and Method: Bougie stylet Placement Confirmation: ETT inserted through vocal cords under direct vision,  breath sounds checked- equal and bilateral and positive ETCO2 Secured at: 21 cm Tube secured with: Tape Dental Injury: Teeth and Oropharynx as per pre-operative assessment  Difficulty Due To: Difficulty was anticipated, Difficult Airway- due to anterior larynx and Difficult Airway- due to limited oral opening Future Recommendations: Recommend- induction with short-acting agent, and alternative techniques readily available

## 2012-01-13 NOTE — Preoperative (Signed)
Beta Blockers   Reason not to administer Beta Blockers:Not Applicable 

## 2012-01-14 ENCOUNTER — Encounter (HOSPITAL_COMMUNITY): Payer: Self-pay | Admitting: Urology

## 2012-01-14 MED ORDER — HYDROCODONE-ACETAMINOPHEN 5-325 MG PO TABS
1.0000 | ORAL_TABLET | Freq: Four times a day (QID) | ORAL | Status: DC | PRN
  Administered 2012-01-14: 1 via ORAL
  Filled 2012-01-14: qty 1

## 2012-01-14 MED ORDER — BISACODYL 10 MG RE SUPP
10.0000 mg | Freq: Once | RECTAL | Status: AC
  Administered 2012-01-14: 10 mg via RECTAL
  Filled 2012-01-14: qty 1

## 2012-01-14 NOTE — Progress Notes (Signed)
JP drain removed per MD order.  Pt tolerated well.  Dry gauze dressing placed over insertion site.  Will monitor.  Ardyth Gal, RN 01/14/2012

## 2012-01-14 NOTE — Discharge Summary (Signed)
  Date of admission: 01/13/2012  Date of discharge: 01/14/2012  Admission diagnosis: Prostate Cancer  Discharge diagnosis: Prostate Cancer  History and Physical: For full details, please see admission history and physical. Briefly, Joseph Hudson is a 62 y.o. gentleman with localized prostate cancer.  After discussing management/treatment options, he elected to proceed with surgical treatment.  Hospital Course: Joseph Hudson was taken to the operating room on 01/13/2012 and underwent a robotic assisted laparoscopic radical prostatectomy. He tolerated this procedure well and without complications. Postoperatively, he was able to be transferred to a regular hospital room following recovery from anesthesia.  He was able to begin ambulating the night of surgery. He remained hemodynamically stable overnight.  He had excellent urine output with appropriately minimal output from his pelvic drain and his pelvic drain was removed on POD #1.  He was transitioned to oral pain medication, tolerated a clear liquid diet, and had met all discharge criteria and was able to be discharged home later on POD#1.  Laboratory values:  Basename 01/14/12 0510 01/13/12 1404  HGB 13.8 14.0  HCT 40.3 41.3    Disposition: Home  Discharge instruction: He was instructed to be ambulatory but to refrain from heavy lifting, strenuous activity, or driving. He was instructed on urethral catheter care.  Discharge medications:   Medication List  As of 01/14/2012 12:57 PM   START taking these medications         ciprofloxacin 500 MG tablet   Commonly known as: CIPRO   Take 1 tablet (500 mg total) by mouth 2 (two) times daily. Start day prior to office visit for foley removal      HYDROcodone-acetaminophen 5-325 MG per tablet   Commonly known as: NORCO   Take 1-2 tablets by mouth every 6 (six) hours as needed for pain.         CONTINUE taking these medications         azelastine 137 MCG/SPRAY nasal spray   Commonly known  as: ASTELIN      mometasone 50 MCG/ACT nasal spray   Commonly known as: NASONEX      pantoprazole 40 MG tablet   Commonly known as: PROTONIX      ZYRTEC ALLERGY 10 MG Caps   Generic drug: Cetirizine HCl         STOP taking these medications         acetaminophen 500 MG tablet      ibuprofen 200 MG tablet      mulitivitamin with minerals Tabs          Where to get your medications    These are the prescriptions that you need to pick up.   You may get these medications from any pharmacy.         ciprofloxacin 500 MG tablet   HYDROcodone-acetaminophen 5-325 MG per tablet            Followup: He will followup in 1 week for catheter removal and to discuss his surgical pathology results.

## 2012-01-14 NOTE — Progress Notes (Signed)
Patient ID: Joseph Hudson, male   DOB: 1951-06-22, 61 y.o.   MRN: [id number]  1 Day Post-Op Subjective: The patient is doing well.  No nausea or vomiting. Pain is adequately controlled.  Objective: Vital signs in last 24 hours: Temp:  [97.6 F (36.4 C)-98.4 F (36.9 C)] 98.4 F (36.9 C) (03/08 0200) Pulse Rate:  [62-103] 72  (03/08 0200) Resp:  [10-20] 16  (03/08 0200) BP: (113-169)/(65-95) 138/75 mmHg (03/08 0200) SpO2:  [98 %-100 %] 98 % (03/08 0200) Weight:  [73.936 kg (163 lb)] 73.936 kg (163 lb) (03/07 1458)  Intake/Output from previous day: 03/07 0701 - 03/08 0700 In: 4367.5 [P.O.:120; I.V.:3067.5; IV Piggyback:1150] Out: 3965 [Urine:3700; Drains:140; Blood:125] Intake/Output this shift: Total I/O In: -  Out: 3740 [Urine:3600; Drains:140]  Physical Exam:  General: Alert and oriented. CV: RRR Lungs: Clear bilaterally. GI: Soft, Nondistended. Incisions: Dressings intact. Urine: Clear Extremities: Nontender, no erythema, no edema.  Lab Results:  Basename 01/14/12 0510 01/13/12 1404  HGB 13.8 14.0  HCT 40.3 41.3      Assessment/Plan: POD# 1 s/p robotic prostatectomy.  1) SL IVF 2) Ambulate, Incentive spirometry 3) Transition to oral pain medication 4) Dulcolax suppository 5) D/C pelvic drain 6) Plan for likely discharge later today   Moody Bruins. MD   LOS: 1 day   Jamarria Real,LES 01/14/2012, 6:51 AM

## 2012-03-07 ENCOUNTER — Encounter: Payer: Self-pay | Admitting: *Deleted

## 2012-03-08 ENCOUNTER — Encounter: Payer: Self-pay | Admitting: *Deleted

## 2012-03-08 ENCOUNTER — Ambulatory Visit
Admission: RE | Admit: 2012-03-08 | Discharge: 2012-03-08 | Disposition: A | Discharge status: Home / Self Care | Payer: BC Managed Care – PPO | Source: Ambulatory Visit | Attending: Radiation Oncology | Admitting: Radiation Oncology

## 2012-03-08 ENCOUNTER — Encounter: Payer: Self-pay | Admitting: Radiation Oncology

## 2012-03-08 VITALS — BP 132/76 | HR 68 | Temp 98.3°F | Resp 20 | Wt 163.9 lb

## 2012-03-08 DIAGNOSIS — Z87891 Personal history of nicotine dependence: Secondary | ICD-10-CM | POA: Insufficient documentation

## 2012-03-08 DIAGNOSIS — Z79899 Other long term (current) drug therapy: Secondary | ICD-10-CM | POA: Insufficient documentation

## 2012-03-08 DIAGNOSIS — C61 Malignant neoplasm of prostate: Secondary | ICD-10-CM | POA: Insufficient documentation

## 2012-03-08 DIAGNOSIS — Z9079 Acquired absence of other genital organ(s): Secondary | ICD-10-CM | POA: Insufficient documentation

## 2012-03-08 DIAGNOSIS — K219 Gastro-esophageal reflux disease without esophagitis: Secondary | ICD-10-CM | POA: Insufficient documentation

## 2012-03-08 HISTORY — DX: Major depressive disorder, single episode, unspecified: F32.9

## 2012-03-08 HISTORY — DX: Depression, unspecified: F32.A

## 2012-03-08 NOTE — Progress Notes (Signed)
Please see the Nurse Progress Note in the MD Initial Consult Encounter for this patient. 

## 2012-03-08 NOTE — Progress Notes (Signed)
Hx Radical Prostatectomy and BPLND 01/13/12 Adenocarcinoma,Gleason=3+4=7,Psa=5.08 PSA on 02/23/12=<0.01 PSA 04/27/11=5.08 PSA 03/2010=1.46 DX 08/13/11=Prostate CA,gleason 3+3=6 volume=26.6cc,(5/12) cores positive  Married, 2 children, mother endometrial ca,  20  Years, still living age 61   Allergies:NKDA  Using 4-5 pads daily, Alert oriented x 3, nocturia ,up every  1.5hr-2 hr , normal bowel movements,

## 2012-03-08 NOTE — Progress Notes (Signed)
Radiation Oncology         [phone] ________________________________  Initial outpatient Consultation  Name: Joseph Hudson MRN: [id number]  Date: 03/08/2012  DOB: [birth year]  WU:[id number],NWGNF Leonette Most, MD, MD  Crecencio Mc, MD   REFERRING PHYSICIAN: Crecencio Mc, MD  DIAGNOSIS: 61 year old gentleman with pathologic stage T3a adenocarcinoma prostate with positive apical margin and extracapsular extension and undetectable postoperative PSA  HISTORY OF PRESENT ILLNESS::Joseph Hudson is a 61 y.o. male who was initially seen by Dr. Su Grand for rising PSA in August 2012. Joseph Hudson PSA increased from 1.46 in May of 2001-5.08 on June 19 of 2012. Digital rectal exam performed by Dr. Brunilda Payor revealed induration of the left prostate gland.  Transrectal ultrasound with 12 biopsies demonstrated adenocarcinoma with a Gleason score 3+3 in 5/12 core specimens primarily on the left side.  The patient elected to proceed with robotic-assisted laparoscopic radical prostatectomy on 01/13/2012 with Dr. Heloise Purpura. Pathology revealed adenocarcinoma with a Gleason score 3+4 primarily involving the left lobe at 20% and right lobe 1%. Tumor was found to be broadly present at the bilateral apical margins and extraprostatic extension was present. 2 left pelvic lymph nodes and 3 right pelvic lymph nodes were negative. Seminal vesicles were negative. Bladder neck margins were negative. The patient's initial postoperative PSA level on April 17 was undetectable at less than 0.01. He has currently been referred today for discussion of potential radiation treatment options.   PREVIOUS RADIATION THERAPY: No  PAST MEDICAL HISTORY:  has a past medical history of Tinnitus of right ear; GERD (gastroesophageal reflux disease); Anxiety; Polyp of colon (2005, results benign); Rhinitis; Prostate cancer (08/13/11); ED (erectile dysfunction); Ulcer; and Depression.    PAST SURGICAL HISTORY: Past Surgical History  Procedure Date  .  Lipoma removed 5-6 yrs ago  . Right rotator cuff repair 2008  . Robot assisted laparoscopic radical prostatectomy 01/13/2012    Procedure: ROBOTIC ASSISTED LAPAROSCOPIC RADICAL PROSTATECTOMY LEVEL 2;  Surgeon: Crecencio Mc, MD;  Location: [facility];  Service: Urology;  Laterality: N/A;    FAMILY HISTORY: family history is not on file.  SOCIAL HISTORY:  reports that he quit smoking about 28 years ago. He has never used smokeless tobacco. He reports that he drinks about 1.2 ounces of alcohol per week. He reports that he does not use illicit drugs.  ALLERGIES: Review of patient's allergies indicates no known allergies.  MEDICATIONS:  Current Outpatient Prescriptions  Medication Sig Dispense Refill  . azelastine (ASTELIN) 137 MCG/SPRAY nasal spray Place 1 spray into the nose 2 (two) times daily. Use in each nostril as directed      . Cetirizine HCl (ZYRTEC ALLERGY) 10 MG CAPS Take by mouth every morning.      . mometasone (NASONEX) 50 MCG/ACT nasal spray Place 2 sprays into the nose 2 (two) times daily. Fluticasone nasal spray      . Multiple Vitamins-Minerals (MULTIVITAMIN PO) Take 1 tablet by mouth daily.      . pantoprazole (PROTONIX) 40 MG tablet Take 40 mg by mouth every morning.       . sildenafil (VIAGRA) 100 MG tablet Take 100 mg by mouth as needed.      . tadalafil (CIALIS) 10 MG tablet Take 10 mg by mouth daily as needed.        REVIEW OF SYSTEMS:  A 15 point review of systems is documented in the electronic medical record. This was obtained by the nursing staff. However, I reviewed this with the patient to discuss  relevant findings and make appropriate changes.  Pertinent items are noted in HPI.  Joseph Hudson did fill out questionnaires today regarding erectile function and IP SS scores in the postoperative setting. Joseph Hudson urinary outflow obstructive score of 18 suggesting moderate urinary symptomatology. He indicated that he is suffering with lack of erectile function to date since surgery.  Does  suffer with some bouts of incontinence wearing up to 4-5 pads daily.    PHYSICAL EXAM: The patient is in no acute distress today. He is alert and oriented. Joseph Hudson weight is 163 pounds temp is 98.3 pulse is 68 respiratory rate is 20 blood pressure is 132/76.  Detailed physical exam is deferred today in the interest of patient counseling time.   LABORATORY DATA:  Lab Results  Component Value Date   WBC 4.0 01/06/2012   HGB 13.8 01/14/2012   HCT 40.3 01/14/2012   MCV 94.7 01/06/2012   PLT 204 01/06/2012   Lab Results  Component Value Date   NA 137 01/06/2012   K 4.5 01/06/2012   CL 100 01/06/2012   CO2 30 01/06/2012   Path Report: Collected Date: 01/13/2012 Received Date: 01/13/2012 Physician: Heloise Purpura REPORT OF SURGICAL PATHOLOGY FINAL DIAGNOSIS Diagnosis 1. Prostate, radical resection - PROSTATIC ADENOCARCINOMA, GLEASON GRADE 3 + 4 = 7, SEE COMMENT. - RIGHT AND LEFT APICAL MARGIN, POSITIVE FOR TUMOR. - BLADDER NECK MARGIN, NEGATIVE FOR TUMOR. - EXTENSIVE PERINEURAL INVASION IDENTIFIED. - SEE TUMOR SYNOPTIC TEMPLATE BELOW. 2. Lymph nodes, regional resection, left pelvic - TWO LYMPH NODES, NEGATIVE FOR TUMOR (0/2) a 3. Lymph nodes, regional resection, right pelvic - THREE LYMPH NODES, NEGATIVE FOR TUMOR (0/3). Microscopic Comment 1. PROSTATE RADICAL RESECTION/PROSTATECTOMY Histologic type: Adenocarcinoma. Gleasons Score: 3 + 4 = 7. Tertiary score (if applicable): None. Involving (half lobe or less, one or both lobes): Both lobes. Estimated proportion (%) of prostate tissue involved by tumor: Left lobe - 20%; Right lobe - 1%. Extraprostatic extension: Present. Involvement of apex: Present. Margins: Tumor is broadly present at bilateral apical margins. Seminal vesicles: Negative. Lymph-Vascular invasion: Absent. Treatment effect: N/A. Lymph nodes: number examined 5; number positive 0. TNM code: pT2, pN0. Comment: The case was discussed with Dr. Laverle Patter on 01/17/2012. (CRR:eps  01/17/12) Specimen Gross and Clinical Information Specimen(s) Obtained: 1. Prostate, radical resection 2. Lymph nodes, regional resection, left pelvic 3. Lymph nodes, regional resection, right pelvic Specimen Clinical Information 1. Prostate cancer (las) Gross 1. Specimen: Prostate with attached bilateral seminal vesicles. Weight: 50 grams. Prostate size: 4.9 cm from right to left, 4.2 cm from apex to base, 4 cm from anterior to posterior. Seminal vesicles size: Each averages 3.5 x 1.4 x 0.8 cm. Inked margins: Left side yellow, right side black. Cut surface of prostate: There are tan pink to hyperemic, diffusely nodular cut surfaces. A discrete lesion or mass is not identified. Cut surface of seminal vesicles: Unremarkable. Block Summary: A = radial sections of left apex. B = radial sections of right apex. C = left mid anterior. D = right mid anterior. E - I = left posterior, apex to base. J - N = right posterior, apex to base. O, P = radial sections of bladder neck margin. Q = left seminal vesicle. R = right seminal vesicle. Total = eighteen blocks. (SW:eps 01/14/12) 2. 2-Received in formalin is a 7.0 x 4.0 x 1.0 cm aggregate of tan yellow lobulated adipose tissue. Upon dissection two lymph node candidates are identified measuring 0.5 and 3.0 cm in maximum dimension. The lymph nodes are entirely  submitted as follows: A- one whole lymph node candidate B-C- one lymph node candidate serially sectioned. 3. Received in formalin is a 7.0 x 2.5 x 1.0 cm aggregate of tan yellow lobulated adipose tissue. Upon dissection three possible lymph nodes candidates are identified ranging from 1.0 to 1.5 cm in greatest dimension. The lymph nodes are entirely submitted as follows: A- 1 bisected lymph node candidate B- 1 bisected lymph node candidate C- 1 bisected lymph node candidate (JK:mw 01-13-12)     IMPRESSION: Joseph Hudson is a 61 year old gentleman with pathologic stage T3a adenocarcinoma  prostate with positive apical margin and extracapsular extension and undetectable postoperative PSA.  He is at elevated risk for recurrence based on Joseph Hudson adverse pathology features and may benefit from radiotherapy in the adjuvant or salvage setting.   PLAN:Today, I spoke with the patient and Joseph Hudson wife about the findings from Joseph Hudson prostatectomy surgery. We discussed the influence of pathology findings on the likelihood for cancer recurrence and survival. We talked about how pathology risk factors such as positive margin, extracapsular extension, and seminal vesicle involvement increase the risk for prostate cancer recurrence. We discussed how adjuvant radiotherapy or salvage radiotherapy might be employed to reduce the risk for tumor recurrence. We discussed the findings of the SWOG randomized trial which showed a benefit associated with adjuvant radiotherapy for patients with any of these adverse risk factors.*    We talked about the fact that the trial did lead to a decrease in the risk for disease recurrence as well as an increased overall survival likelihood. We also discussed some of the critiques related to this trial including the lack of dogmatic followup and treatment recommendations in the observation group.  30% of patients in the observation group ultimately received salvage radiotherapy, but the institution of salvage treatment in terms of PSA cutoffs, timing, and techniques was not specified in the trial design.  I explained the trial would have been more helpful if patients in the observation arm were given directives about how and when to consider salvage radiotherapy. This is a short coming of the trial outcome since close PSA followup with immediate salvage radiotherapy could potentially mitigate some of the potential survival benefit associated with immediate adjuvant radiotherapy. Accordingly, we certainly would consider adjuvant radiotherapy for patients with high-risk pathology features but we  weighed the pros and cons of this approach versus close PSA surveillance for salvage radiation. We talked about the logistics and delivery of prostate fossa radiotherapy and discussed the potential acute and late sequelae associated with radiotherapy. We discussed the treatment techniques and modalities focusing on to TomoTherapy. At this point, the patient would like to focus on continued improvement in urinary continence and recovery from surgery. He looks forward to undergoing an additional PSA determination in 3 months. Ideally, the patient would like to delay or forego radiotherapy if Joseph Hudson PSA remains undetectable. I think this is a reasonable approach after reviewing the available options in detail. If Joseph Hudson PSA becomes detectable, I would recommend radiation.    I spent 60 minutes minutes face to face with the patient and more than 50% of that time was spent in counseling and/or coordination of care.     ------------------------------------------------  Artist Pais. Kathrynn Running, M.D.     *[address] [facility], [place] CM, Jenita Seashore, [place] MS, Lacinda Axon D, Messing E, Forman J, Chin J, [place] G, [place] [facility], [facility].  Adjuvant radiotherapy for pathological T3N0M0 prostate cancer significantly reduces risk of metastases and improves survival: long-term followup of  a randomized clinical trial. J Urol. 2009 Mar;181(3):956-62.

## 2013-02-04 ENCOUNTER — Encounter (HOSPITAL_BASED_OUTPATIENT_CLINIC_OR_DEPARTMENT_OTHER): Payer: Self-pay

## 2013-02-04 ENCOUNTER — Emergency Department (HOSPITAL_BASED_OUTPATIENT_CLINIC_OR_DEPARTMENT_OTHER)
Admission: EM | Admit: 2013-02-04 | Discharge: 2013-02-04 | Disposition: A | Discharge status: Home / Self Care | Payer: PRIVATE HEALTH INSURANCE | Attending: Emergency Medicine | Admitting: Emergency Medicine

## 2013-02-04 DIAGNOSIS — Z8546 Personal history of malignant neoplasm of prostate: Secondary | ICD-10-CM | POA: Insufficient documentation

## 2013-02-04 DIAGNOSIS — S01411A Laceration without foreign body of right cheek and temporomandibular area, initial encounter: Secondary | ICD-10-CM

## 2013-02-04 DIAGNOSIS — Y9302 Activity, running: Secondary | ICD-10-CM | POA: Insufficient documentation

## 2013-02-04 DIAGNOSIS — S01409A Unspecified open wound of unspecified cheek and temporomandibular area, initial encounter: Secondary | ICD-10-CM | POA: Insufficient documentation

## 2013-02-04 DIAGNOSIS — K219 Gastro-esophageal reflux disease without esophagitis: Secondary | ICD-10-CM | POA: Insufficient documentation

## 2013-02-04 DIAGNOSIS — Z8659 Personal history of other mental and behavioral disorders: Secondary | ICD-10-CM | POA: Insufficient documentation

## 2013-02-04 DIAGNOSIS — Z8709 Personal history of other diseases of the respiratory system: Secondary | ICD-10-CM | POA: Insufficient documentation

## 2013-02-04 DIAGNOSIS — Z8669 Personal history of other diseases of the nervous system and sense organs: Secondary | ICD-10-CM | POA: Insufficient documentation

## 2013-02-04 DIAGNOSIS — Z87891 Personal history of nicotine dependence: Secondary | ICD-10-CM | POA: Insufficient documentation

## 2013-02-04 DIAGNOSIS — Z860100 Personal history of colon polyps, unspecified: Secondary | ICD-10-CM | POA: Insufficient documentation

## 2013-02-04 DIAGNOSIS — N529 Male erectile dysfunction, unspecified: Secondary | ICD-10-CM | POA: Insufficient documentation

## 2013-02-04 DIAGNOSIS — Y9289 Other specified places as the place of occurrence of the external cause: Secondary | ICD-10-CM | POA: Insufficient documentation

## 2013-02-04 DIAGNOSIS — Z872 Personal history of diseases of the skin and subcutaneous tissue: Secondary | ICD-10-CM | POA: Insufficient documentation

## 2013-02-04 DIAGNOSIS — IMO0002 Reserved for concepts with insufficient information to code with codable children: Secondary | ICD-10-CM | POA: Insufficient documentation
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
## 2013-02-04 NOTE — ED Notes (Signed)
D/c home- icepack given for home use 
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
## 2013-02-06 NOTE — ED Provider Notes (Signed)
Medical screening examination/treatment/procedure(s) were performed by non-physician practitioner and as supervising physician I was immediately available for consultation/collaboration.  Gilda Crease, MD 02/06/13 [phone]

## 2013-02-18 NOTE — ED Provider Notes (Signed)
Medical screening examination/treatment/procedure(s) were performed by non-physician practitioner and as supervising physician I was immediately available for consultation/collaboration.  Gilda Crease, MD 02/18/13 [phone]

## 2013-04-15 IMAGING — CR DG CHEST 2V
2 series · 2 of 2 positions shown · non-contrast
Comparison: None.

CLINICAL DATA: Preoperative evaluation for radical prostatectomy
and pelvic lymphadenectomy.  Ex-smoker.  No current chest
complaints

CHEST - 2 VIEW

[w chest pa]
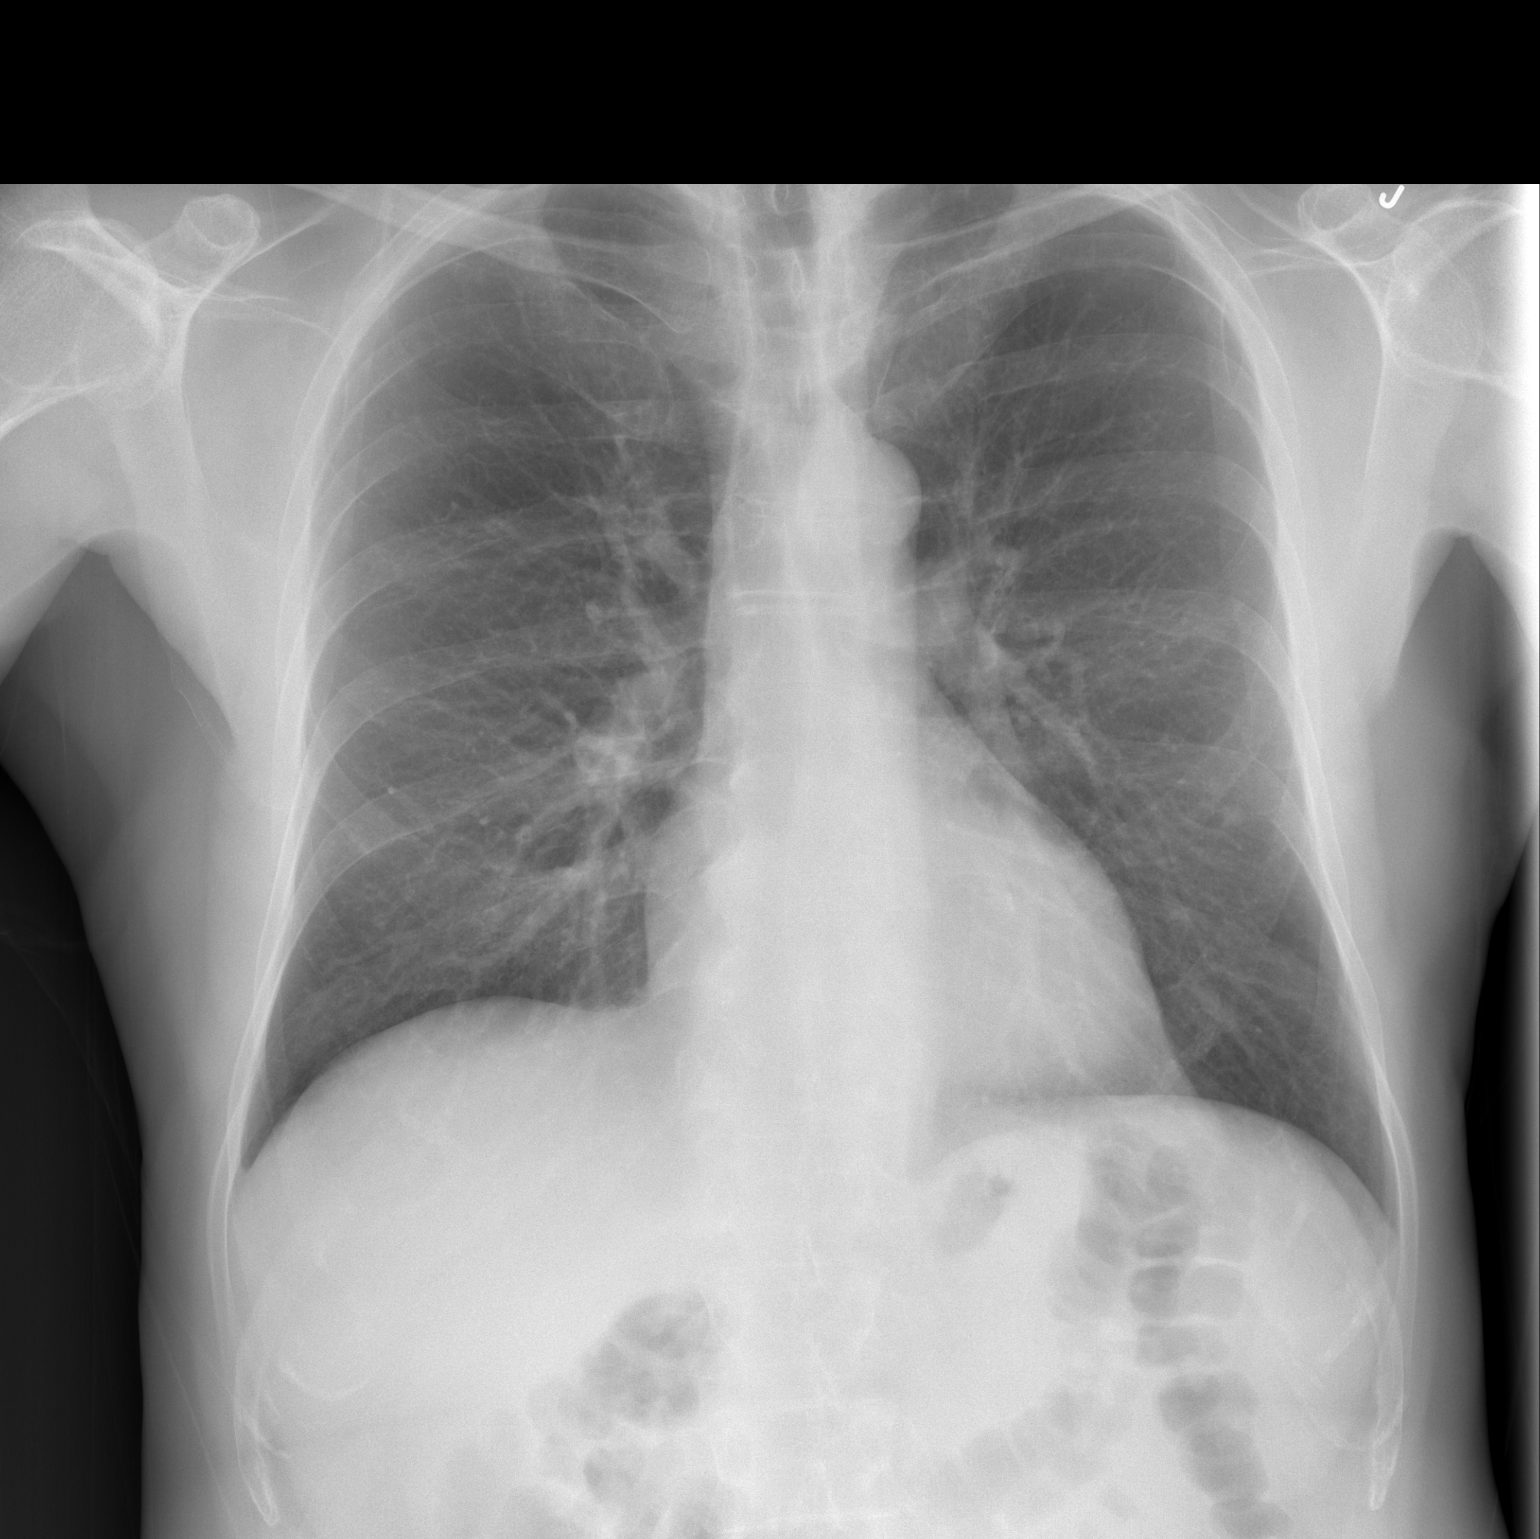

[w chest lat]
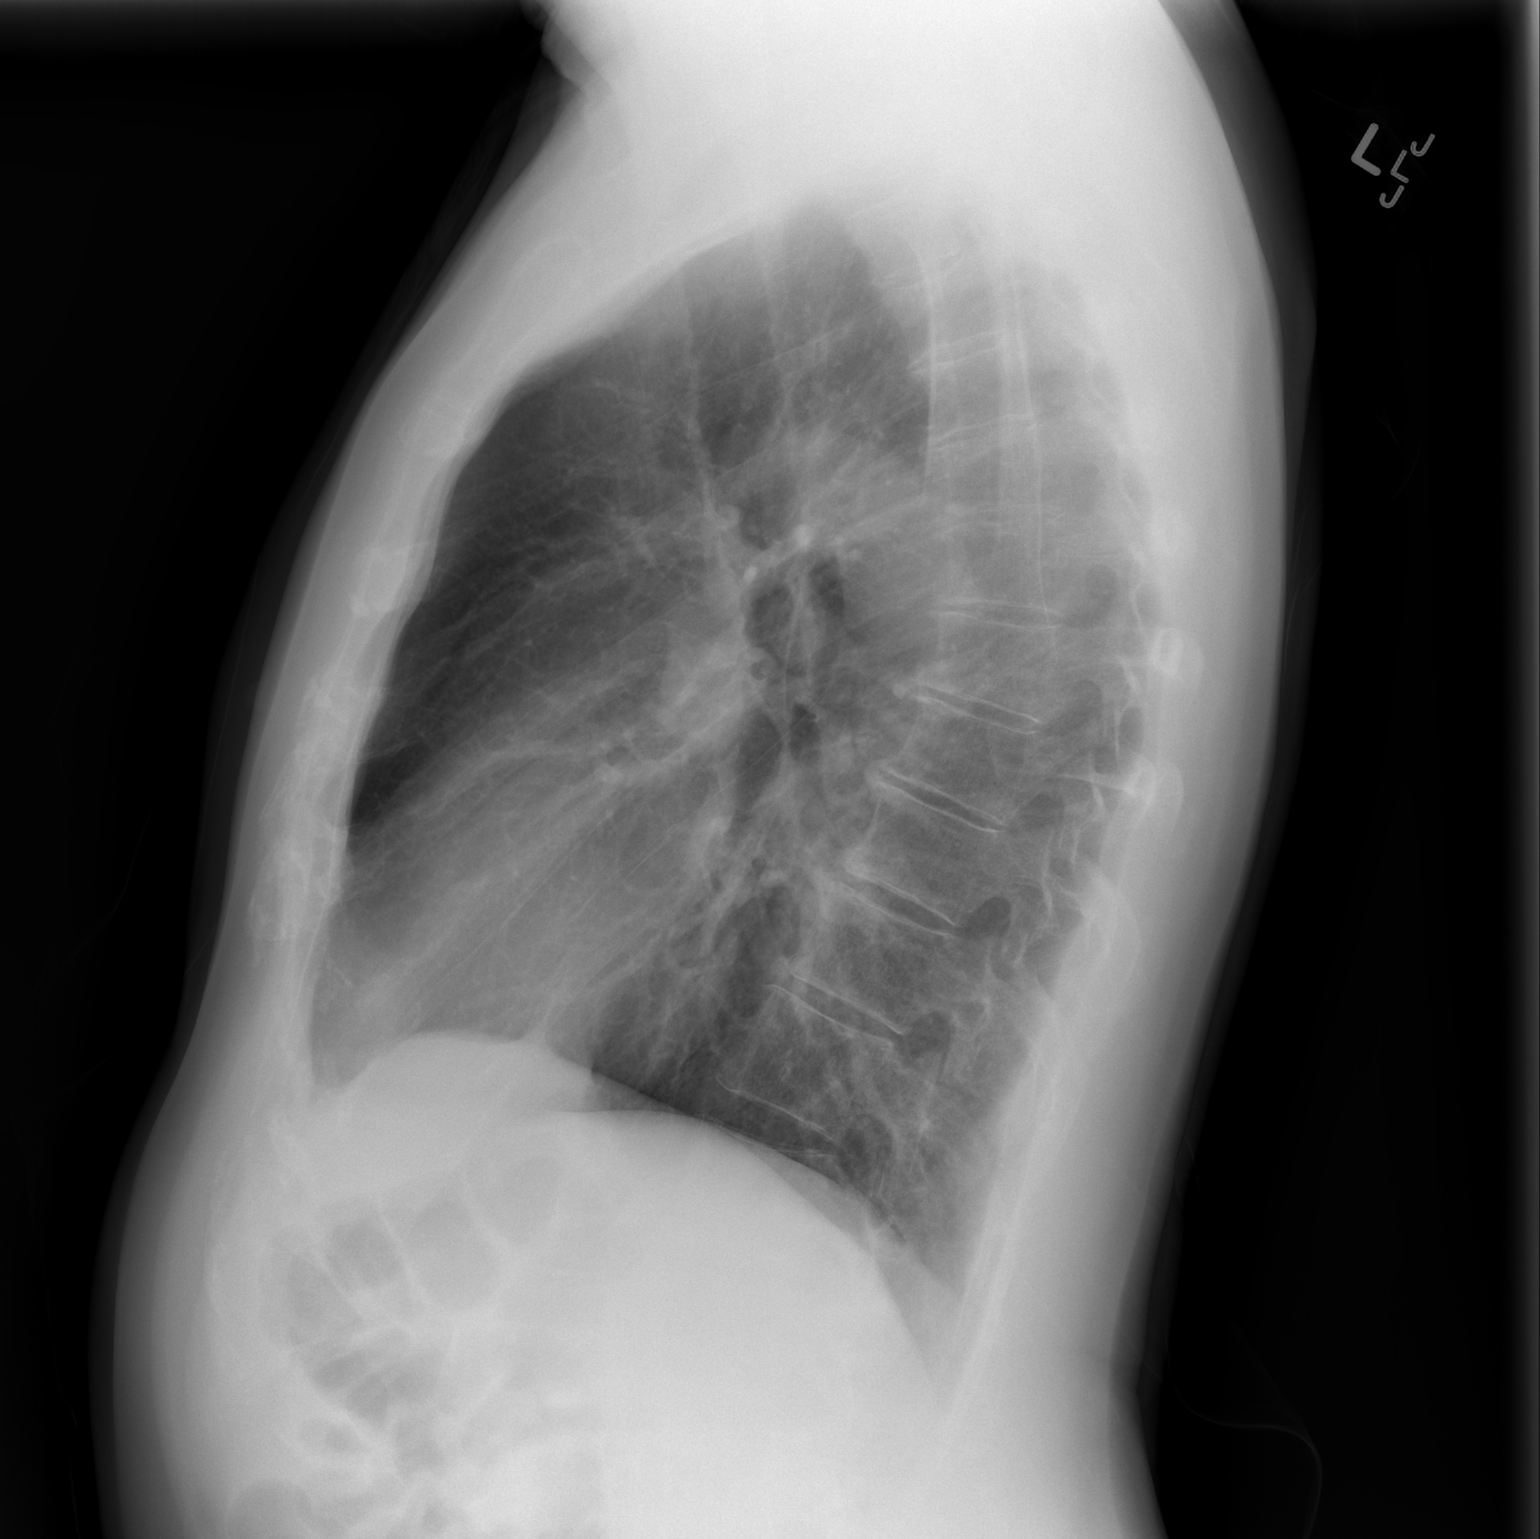

[2 of 2 positions shown; findings below may reference images not displayed]

FINDINGS: Heart and mediastinal contours are within normal limits.
There is a circumscribed nodule identified overlying the left lower
lung zone measuring 7 mm.  This may be related to the left nipple
as no correlative lesion is seen on the lateral view.  Repeat
evaluation with nipple markers is recommended for initial
assessment.

The remainder of the lung fields are clear with no signs of focal
infiltrate or congestive failure.  No pleural fluid or significant
peribronchial cuffing is seen.

Bony structures demonstrate degenerative changes of the mid
thoracic spine and are otherwise intact.
IMPRESSION: 7 mm circumscribed nodular density overlying the left lower lung
zone and seen well only on the PA view.  Repeat evaluation with
nipple markers is recommended for initial assessment.  If this is
not identified to correlate with the patient's left nipple, further
assessment with chest CT would be recommended given the patient's
smoking history and lack of prior comparison exam to confirm
stability.

These results will be called to the ordering clinician or
representative by the Radiologist Assistant, and communication
documented in the PACS Dashboard.

## 2013-04-16 IMAGING — CR DG CHEST 1V
1 series · 1 of 1 positions shown · non-contrast
Comparison: 01/06/2012

CLINICAL DATA: Follow-up chest x-ray with nipple markers

CHEST - 1 VIEW

[w chest pa]
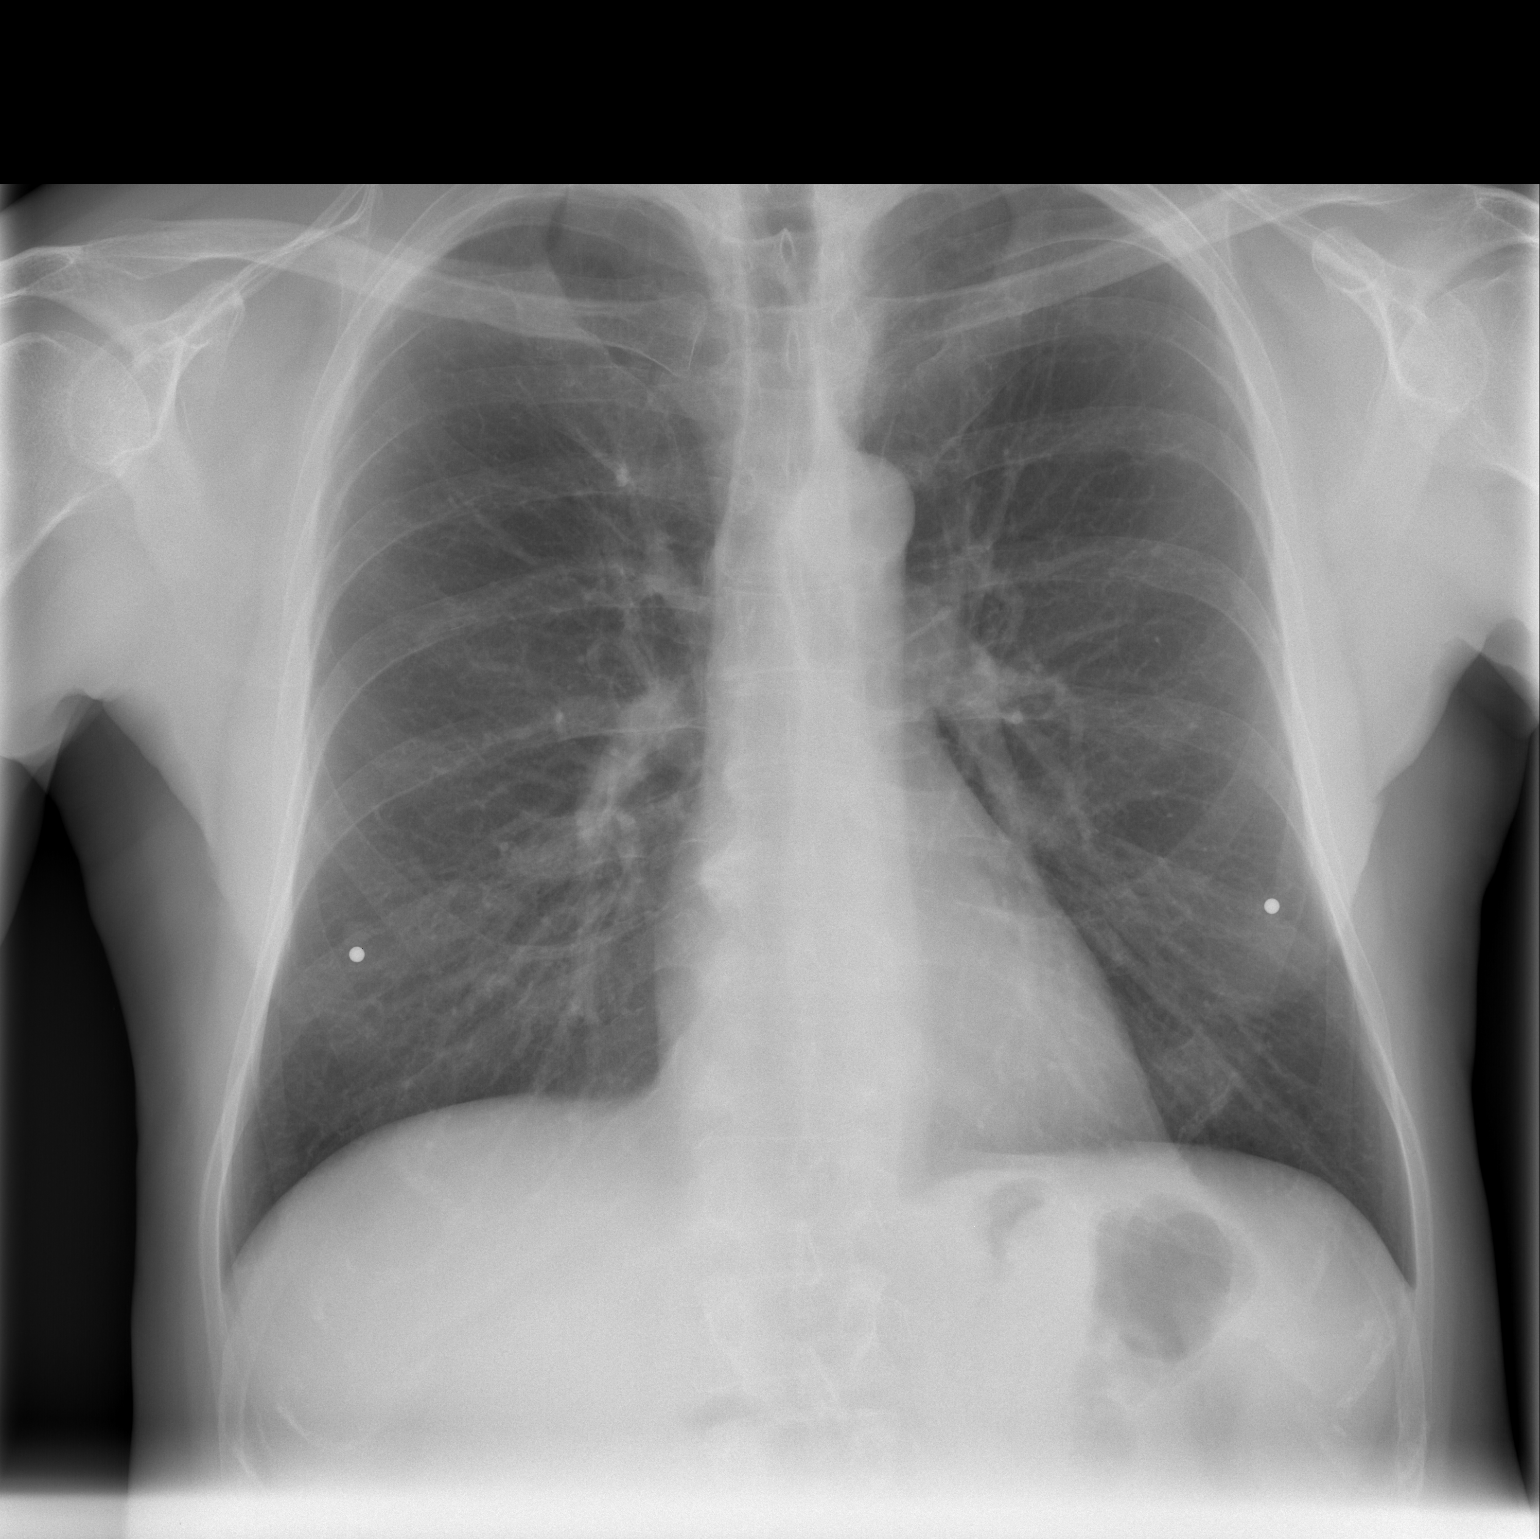

[1 of 1 positions shown; findings below may reference images not displayed]

FINDINGS: Nipple markers are in place.  They correspond to
bibasilar nodular densities previously visualized.  The abnormality
in question is therefore a nipple shadow.  Lungs remain
hyperaerated with bronchitic changes.
IMPRESSION: Basilar abnormalities correspond to the nipple markers and are
therefore nipple shadows.  No evidence of pulmonary parenchymal
nodule.

## 2013-05-15 LAB — BASIC METABOLIC PANEL
BUN: 13 (ref 4–21)
CREATININE: 1.1 (ref 0.6–1.3)
Glucose: 90
POTASSIUM: 4.9 (ref 3.4–5.3)
Sodium: 138 (ref 137–147)

## 2013-05-15 LAB — HEPATIC FUNCTION PANEL
ALK PHOS: 52 (ref 25–125)
ALT: 19 (ref 10–40)
AST: 23 (ref 14–40)
Bilirubin, Total: 0.6

## 2014-06-05 LAB — BASIC METABOLIC PANEL
BUN: 14 (ref 4–21)
CREATININE: 1.1 (ref 0.6–1.3)
Glucose: 81
Potassium: 4.3 (ref 3.4–5.3)
Sodium: 137 (ref 137–147)

## 2014-06-05 LAB — HEPATIC FUNCTION PANEL
ALT: 15 (ref 10–40)
AST: 21 (ref 14–40)
Alkaline Phosphatase: 46 (ref 25–125)
Bilirubin, Total: 0.5

## 2015-11-06 ENCOUNTER — Other Ambulatory Visit: Payer: Self-pay | Admitting: Gastroenterology

## 2015-11-19 ENCOUNTER — Encounter (HOSPITAL_COMMUNITY): Payer: Self-pay | Admitting: *Deleted

## 2015-11-21 ENCOUNTER — Encounter (HOSPITAL_COMMUNITY): Payer: Self-pay | Admitting: *Deleted

## 2015-11-27 ENCOUNTER — Ambulatory Visit (HOSPITAL_COMMUNITY): Payer: BLUE CROSS/BLUE SHIELD | Admitting: Anesthesiology

## 2015-11-27 ENCOUNTER — Encounter (HOSPITAL_COMMUNITY): Payer: Self-pay | Admitting: Anesthesiology

## 2015-11-27 ENCOUNTER — Encounter (HOSPITAL_COMMUNITY)
Admission: RE | Disposition: A | Discharge status: Home / Self Care | Payer: Self-pay | Source: Ambulatory Visit | Attending: Gastroenterology

## 2015-11-27 ENCOUNTER — Ambulatory Visit (HOSPITAL_COMMUNITY)
Admission: RE | Admit: 2015-11-27 | Discharge: 2015-11-27 | Disposition: A | Discharge status: Home / Self Care | Payer: BLUE CROSS/BLUE SHIELD | Source: Ambulatory Visit | Attending: Gastroenterology | Admitting: Gastroenterology

## 2015-11-27 DIAGNOSIS — Z1211 Encounter for screening for malignant neoplasm of colon: Secondary | ICD-10-CM | POA: Insufficient documentation

## 2015-11-27 DIAGNOSIS — Z8546 Personal history of malignant neoplasm of prostate: Secondary | ICD-10-CM | POA: Diagnosis not present

## 2015-11-27 DIAGNOSIS — K5730 Diverticulosis of large intestine without perforation or abscess without bleeding: Secondary | ICD-10-CM | POA: Diagnosis not present

## 2015-11-27 DIAGNOSIS — Z79899 Other long term (current) drug therapy: Secondary | ICD-10-CM | POA: Diagnosis not present

## 2015-11-27 DIAGNOSIS — Z9079 Acquired absence of other genital organ(s): Secondary | ICD-10-CM | POA: Diagnosis not present

## 2015-11-27 DIAGNOSIS — Z8711 Personal history of peptic ulcer disease: Secondary | ICD-10-CM | POA: Diagnosis not present

## 2015-11-27 DIAGNOSIS — Z86010 Personal history of colonic polyps: Secondary | ICD-10-CM | POA: Insufficient documentation

## 2015-11-27 DIAGNOSIS — D120 Benign neoplasm of cecum: Secondary | ICD-10-CM | POA: Insufficient documentation

## 2015-11-27 DIAGNOSIS — K219 Gastro-esophageal reflux disease without esophagitis: Secondary | ICD-10-CM | POA: Insufficient documentation

## 2015-11-27 DIAGNOSIS — Z87891 Personal history of nicotine dependence: Secondary | ICD-10-CM | POA: Insufficient documentation

## 2015-11-27 HISTORY — PX: COLONOSCOPY WITH PROPOFOL: SHX5780

## 2015-11-27 SURGERY — COLONOSCOPY WITH PROPOFOL
Anesthesia: Monitor Anesthesia Care

## 2015-11-27 MED ORDER — SODIUM CHLORIDE 0.9 % IV SOLN: INTRAVENOUS | Status: DC

## 2015-11-27 MED ORDER — PROPOFOL 10 MG/ML IV BOLUS
INTRAVENOUS | Status: AC
  Filled 2015-11-27: qty 40

## 2015-11-27 MED ORDER — GLYCOPYRROLATE 0.2 MG/ML IJ SOLN
INTRAMUSCULAR | Status: AC
  Filled 2015-11-27: qty 1

## 2015-11-27 MED ORDER — ONDANSETRON HCL 4 MG/2ML IJ SOLN
INTRAMUSCULAR | Status: DC | PRN
  Administered 2015-11-27: 4 mg via INTRAVENOUS

## 2015-11-27 MED ORDER — GLYCOPYRROLATE 0.2 MG/ML IJ SOLN
INTRAMUSCULAR | Status: DC | PRN
  Administered 2015-11-27: 0.2 mg via INTRAVENOUS

## 2015-11-27 MED ORDER — ONDANSETRON HCL 4 MG/2ML IJ SOLN
INTRAMUSCULAR | Status: AC
  Filled 2015-11-27: qty 2

## 2015-11-27 MED ORDER — PROPOFOL 10 MG/ML IV BOLUS
INTRAVENOUS | Status: AC
Start: 2015-11-27 — End: 2015-11-27
  Filled 2015-11-27: qty 20

## 2015-11-27 MED ORDER — LACTATED RINGERS IV SOLN
INTRAVENOUS | Status: DC
  Administered 2015-11-27: 1000 mL via INTRAVENOUS

## 2015-11-27 MED ORDER — PROPOFOL 500 MG/50ML IV EMUL
INTRAVENOUS | Status: DC | PRN
  Administered 2015-11-27: 300 ug/kg/min via INTRAVENOUS

## 2015-11-27 SURGICAL SUPPLY — 21 items

## 2015-11-27 NOTE — Anesthesia Postprocedure Evaluation (Signed)
Anesthesia Post Note  Patient: Joseph Hudson  Procedure(s) Performed: Procedure(s) (LRB): COLONOSCOPY WITH PROPOFOL (N/A)  Patient location during evaluation: PACU Anesthesia Type: MAC Level of consciousness: awake and alert Pain management: pain level controlled Vital Signs Assessment: post-procedure vital signs reviewed and stable Respiratory status: spontaneous breathing, nonlabored ventilation, respiratory function stable and patient connected to nasal cannula oxygen Cardiovascular status: stable and blood pressure returned to baseline Anesthetic complications: no    Last Vitals:  Filed Vitals:   11/27/15 1120 11/27/15 1131  BP: 101/73 131/86  Pulse: 97 79  Temp:    Resp: 21 20    Last Pain: There were no vitals filed for this visit.               [name]

## 2015-11-27 NOTE — H&P (Signed)
  Procedure: Surveillance colonoscopy. 03/12/2010 colonoscopy was performed with removal of a 5 mm adenomatous rectal polyp. In 2005, a tubulovillous adenomatous polyp was removed colonoscopically  History: The patient is a 65 year old male born 1951-04-09. He is scheduled to undergo a surveillance colonoscopy today.  Past medical history: Cervical radiculopathy. Prostatectomy for prostate cancer. Allergic rhinitis. Peptic ulcer disease. Shoulder surgery. Left arm fracture surgery.  Medication allergies: None  Exam: The patient is alert and lying comfortably on the endoscopy stretcher. Abdomen is soft and nontender to palpation. Lungs are clear to auscultation. Cardiac exam reveals a regular rhythm.  Plan: Proceed with surveillance colonoscopy

## 2015-11-27 NOTE — Op Note (Signed)
Procedure: Surveillance colonoscopy. Adenomatous colon polyps removed colonoscopically in the past  Endoscopist: Earle Gell  Premedication: Propofol administered by anesthesia  Procedure: The patient was placed in the left lateral decubitus position. Anal inspection and digital rectal exam were normal. The Pentax pediatric colonoscope was introduced into the rectum and advanced to the cecum. A normal-appearing appendiceal orifice and ileocecal valve were identified. Colonic preparation for the exam today was good. Withdrawal time was 8 minutes  Rectum. Normal. Retroflex view of the distal rectum was normal  Sigmoid colon and descending colon. Colonic diverticulosis  Splenic flexure. Normal  Transverse colon. Normal  Hepatic flexure. Normal  Ascending colon. Normal  Cecum and ileocecal valve. A 3 mm sessile polyp was removed from the mid cecum with the cold biopsy forceps  Assessment: A diminutive polyp was removed from the cecum. Otherwise normal surveillance colonoscopy  Recommendation: Schedule repeat surveillance colonoscopy in 5 years

## 2015-11-27 NOTE — Anesthesia Preprocedure Evaluation (Addendum)
Anesthesia Evaluation  Patient identified by MRN, date of birth, ID band Patient awake    Reviewed: Allergy & Precautions, NPO status , Patient's Chart, lab work & pertinent test results  Airway Mallampati: II  TM Distance: >3 FB Neck ROM: Full    Dental no notable dental hx.    Pulmonary former smoker,    Pulmonary exam normal breath sounds clear to auscultation       Cardiovascular Exercise Tolerance: Good negative cardio ROS Normal cardiovascular exam Rhythm:Regular Rate:Normal     Neuro/Psych PSYCHIATRIC DISORDERS Anxiety Depression negative neurological ROS     GI/Hepatic Neg liver ROS, GERD  Medicated,  Endo/Other  negative endocrine ROS  Renal/GU negative Renal ROS  negative genitourinary   Musculoskeletal negative musculoskeletal ROS (+)   Abdominal   Peds negative pediatric ROS (+)  Hematology negative hematology ROS (+)   Anesthesia Other Findings   Reproductive/Obstetrics negative OB ROS                             Anesthesia Physical Anesthesia Plan  ASA: II  Anesthesia Plan: MAC   Post-op Pain Management:    Induction: Intravenous  Airway Management Planned: Natural Airway  Additional Equipment:   Intra-op Plan:   Post-operative Plan:   Informed Consent: I have reviewed the patients History and Physical, chart, labs and discussed the procedure including the risks, benefits and alternatives for the proposed anesthesia with the patient or authorized representative who has indicated his/her understanding and acceptance.   Dental advisory given  Plan Discussed with: CRNA  Anesthesia Plan Comments:         Anesthesia Quick Evaluation

## 2015-11-27 NOTE — Transfer of Care (Signed)
Immediate Anesthesia Transfer of Care Note  Patient: Joseph Hudson  Procedure(s) Performed: Procedure(s): COLONOSCOPY WITH PROPOFOL (N/A)  Patient Location: PACU  Anesthesia Type:MAC  Level of Consciousness:  sedated, patient cooperative and responds to stimulation  Airway & Oxygen Therapy:Patient Spontanous Breathing and Patient connected to face mask oxgen  Post-op Assessment:  Report given to PACU RN and Post -op Vital signs reviewed and stable  Post vital signs:  Reviewed and stable  Last Vitals:  Filed Vitals:   11/27/15 1020  BP: 167/75  Pulse: 62  Temp: 36.9 C  Resp: 8    Complications: No apparent anesthesia complications

## 2015-11-27 NOTE — Discharge Instructions (Signed)

## 2015-11-28 ENCOUNTER — Encounter (HOSPITAL_COMMUNITY): Payer: Self-pay | Admitting: Gastroenterology

## 2017-04-01 ENCOUNTER — Encounter: Payer: Self-pay | Admitting: Internal Medicine

## 2017-04-01 ENCOUNTER — Ambulatory Visit (INDEPENDENT_AMBULATORY_CARE_PROVIDER_SITE_OTHER): Payer: BLUE CROSS/BLUE SHIELD | Admitting: Internal Medicine

## 2017-04-01 VITALS — BP 140/78 | HR 67 | Temp 97.4°F | Ht 67.0 in | Wt 170.4 lb

## 2017-04-01 DIAGNOSIS — F411 Generalized anxiety disorder: Secondary | ICD-10-CM | POA: Diagnosis not present

## 2017-04-01 DIAGNOSIS — J3089 Other allergic rhinitis: Secondary | ICD-10-CM | POA: Diagnosis not present

## 2017-04-01 DIAGNOSIS — Z8546 Personal history of malignant neoplasm of prostate: Secondary | ICD-10-CM | POA: Diagnosis not present

## 2017-04-01 DIAGNOSIS — K219 Gastro-esophageal reflux disease without esophagitis: Secondary | ICD-10-CM

## 2017-04-01 MED ORDER — ZOSTER VAC RECOMB ADJUVANTED 50 MCG/0.5ML IM SUSR: 0.5000 mL | Freq: Once | INTRAMUSCULAR | 1 refills | Status: AC

## 2017-04-01 NOTE — Patient Instructions (Signed)
Continue current medications as ordered  Follow up in 1 month for CPE

## 2017-04-01 NOTE — Progress Notes (Signed)
Patient ID: Joseph Hudson, male   DOB: 1951-09-19, 66 y.o.   MRN: [id number]    Location:  [facility] Place of Service: OFFICE    Advanced Directive information Does Patient Have a Medical Advance Directive?: No, Would patient like information on creating a medical advance directive?: No - Patient declined  Chief Complaint  Patient presents with  . Establish Care    New patient to establish care    HPI:  66 yo male seen today to establish care. Previous PCP Dr Doyle Askew. He is self employed. He has no specific concerns.  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 He noted they are stress induced. He has sx's 1-2 times per yr  Hx prostate CA (dx in 2012) - was mx by Dr Alinda Money with [facility]. He is s/p radical prostetectomy with 5/12 LN (+) as tumor was found on outside of prostate.  He has stress incontinence with min leakage. Last PSA < 0 about 2 yr ago. He takes prn viagra but states it does not help all the time but does work some time. He saw no benefit from cialis.  GERD - stable on protonix. He has intermittent flatulence but stableon prn gas x  OA - he has right hip pain. He is followed by Ortho Dr Percell Miller. He had right rotator cuff repair some time ago also  Hearing loss in both ears - wears hearing aids. He has right tinnitus.  Allergic rhintis - stable on astelin, flonase, mucinex, sudafed, cetirizine  Past Medical History:  Diagnosis Date  . Anxiety   . Depression   . ED (erectile dysfunction)   . GERD (gastroesophageal reflux disease)   . Polyp of colon 2005, results benign  . Prostate cancer ([place]) 08/13/11   dx Adenocarcinom,gleason=3+3=6,PSA=5.08,(5/12)nodes positive,volume=26.6cc  . Rhinitis   . Tinnitus of right ear   . Ulcer    hx peptic ulcer    Past Surgical History:  Procedure Laterality Date  . COLONOSCOPY WITH PROPOFOL N/A 11/27/2015   Procedure: COLONOSCOPY WITH PROPOFOL;  Surgeon: Garlan Fair, MD;   Location: [facility];  Service: Endoscopy;  Laterality: N/A;  . lipoma removed  5-6 yrs ago   x5 sites- on body  . right rotator cuff repair  2008  . ROBOT ASSISTED LAPAROSCOPIC RADICAL PROSTATECTOMY  01/13/2012   Procedure: ROBOTIC ASSISTED LAPAROSCOPIC RADICAL PROSTATECTOMY LEVEL 2;  Surgeon: Dutch Gray, MD;  Location: [facility];  Service: Urology;  Laterality: N/A;    Patient Care Team: Orpah Melter, MD as PCP - General (Family Medicine)  Social History   Social History  . Marital status: Married    Spouse name: N/A  . Number of children: 2  . Years of education: N/A   Occupational History  .      commercial cabintry here at [employer]   Social History Main Topics  . Smoking status: Former Smoker    Packs/day: 1.50    Years: 25.00    Quit date: 11/09/1983  . Smokeless tobacco: Never Used  . Alcohol use 1.2 oz/week    2 Cans of beer per week     Comment: daily 2-3 12 oz beers  occasional cocktail   . Drug use: No     Comment: ciagrs quit 8 years ago, cigarettes quit 1985  . Sexual activity: Not on file   Other Topics Concern  . Not on file   Social History Narrative  . No  narrative on file     reports that he quit smoking about 33 years ago. He has a 37.50 pack-year smoking history. He has never used smokeless tobacco. He reports that he drinks about 1.2 oz of alcohol per week . He reports that he does not use drugs.  Family History  Problem Relation Age of Onset  . Thyroid disease Mother   . Heart attack Mother   . Heart attack Father   . Heart attack Sister   . Lupus Sister   . Autoimmune disease Sister   . Allergic rhinitis Daughter   . Arthritis Daughter   . Autoimmune disease Daughter    Family Status  Relation Status  . Mother Deceased at age 39       endometrial cancer dx 20 years ago, 76 now  . Father Deceased at age 15       heart problems  . Annamarie Major Deceased       prostate ca  . Sister Deceased at age 87  . Brother Alive, age  61y  . Sister Aloha Gell, age 48y  . Sister Alive, age 32y  . Daughter Alive, age 63y  . Daughter Alive, age 10y    Immunization History  Administered Date(s) Administered  . Influenza-Unspecified 09/04/2011, 11/09/2015  . Pneumococcal Polysaccharide-23 11/08/2014  . Zoster 11/08/2014    No Known Allergies  Medications: Patient's Medications  New Prescriptions   No medications on file  Previous Medications   ACYCLOVIR (ZOVIRAX) 400 MG TABLET    Take 400 mg by mouth 2 (two) times daily as needed.   ALPRAZOLAM (XANAX) 0.25 MG TABLET    Take 0.25 mg by mouth as needed for anxiety.   AZELASTINE (ASTELIN) 137 MCG/SPRAY NASAL SPRAY    Place 2 sprays into the nose 2 (two) times daily. Use in each nostril as directed   CETIRIZINE HCL (ZYRTEC ALLERGY) 10 MG CAPS    Take 10 mg by mouth every morning.    FLUTICASONE (FLONASE) 50 MCG/ACT NASAL SPRAY    Place 2 sprays into both nostrils daily.    GUAIFENESIN (MUCINEX MAXIMUM STRENGTH) 1200 MG TB12    Take 1 tablet by mouth as needed.   MULTIPLE VITAMINS-MINERALS (MULTIVITAMIN PO)    Take 1 tablet by mouth daily.   NAPROXEN SODIUM (ANAPROX) 220 MG TABLET    Take 220 mg by mouth daily as needed.   PANTOPRAZOLE (PROTONIX) 40 MG TABLET    Take 40 mg by mouth every morning.    PSEUDOEPHEDRINE HCL (SUDAFED 24 HOUR NON-DROWSY) 240 MG TB24    Take 1 tablet by mouth as needed.   SILDENAFIL (VIAGRA) 100 MG TABLET    Take 100 mg by mouth daily as needed for erectile dysfunction.   SIMETHICONE (MYLICON) 329 MG CHEWABLE TABLET    Chew 125 mg by mouth as needed for flatulence.  Modified Medications   No medications on file  Discontinued Medications   No medications on file    Review of Systems  HENT: Positive for congestion (sinus problems), hearing loss (wears hearing aids) and tinnitus.   Eyes: Positive for visual disturbance (wears glasses).  Gastrointestinal:       Flatulence  Musculoskeletal:       Joint stiffness  Allergic/Immunologic: Positive  for environmental allergies.  Psychiatric/Behavioral: The patient is nervous/anxious.   All other systems reviewed and are negative.   Vitals:   04/01/17 0849  BP: 140/78  Pulse: 67  Temp: 97.4 F (36.3 C)  TempSrc: Oral  SpO2: 97%  Weight: 170 lb 6.4 oz (77.3 kg)  Height: 5\' 7"  (1.702 m)   Body mass index is 26.69 kg/m.  Physical Exam  Constitutional: He is oriented to person, place, and time. He appears well-developed and well-nourished.  HENT:  Mouth/Throat: Oropharynx is clear and moist.  Eyes: Pupils are equal, round, and reactive to light. No scleral icterus.  Neck: Neck supple. Carotid bruit is not present. No thyromegaly present.  Cardiovascular: Normal rate, regular rhythm, normal heart sounds and intact distal pulses.  Exam reveals no gallop and no friction rub.   No murmur heard. no distal LE swelling. No calf TTP  Pulmonary/Chest: Effort normal and breath sounds normal. He has no wheezes. He has no rales. He exhibits no tenderness.  Abdominal: Soft. Bowel sounds are normal. He exhibits no distension, no abdominal bruit, no pulsatile midline mass and no mass. There is no hepatomegaly. There is no tenderness. There is no rebound and no guarding.  Lymphadenopathy:    He has no cervical adenopathy.  Neurological: He is alert and oriented to person, place, and time. He has normal reflexes.  Skin: Skin is warm and dry. No rash noted.  Psychiatric: He has a normal mood and affect. His behavior is normal. Judgment and thought content normal.     Labs reviewed: No visits with results within 3 Month(s) from this visit.  Latest known visit with results is:  Admission on 01/13/2012, Discharged on 01/14/2012  Component Date Value Ref Range Status  . ABO/RH(D) 01/13/2012 A POS   Final  . Antibody Screen 01/13/2012 NEG   Final  . Sample Expiration 01/13/2012 01/16/2012   Final  . ABO/RH(D) 01/13/2012 A POS   Final  . Hemoglobin 01/13/2012 14.0  13.0 - 17.0 g/dL Final  .  HCT 01/13/2012 41.3  39.0 - 52.0 % Final  . Hemoglobin 01/14/2012 13.8  13.0 - 17.0 g/dL Final  . HCT 01/14/2012 40.3  39.0 - 52.0 % Final    No results found.   Assessment/Plan   ICD-9-CM ICD-10-CM   1. Seasonal allergic rhinitis due to other allergic trigger 477.8 J30.89   2. Gastroesophageal reflux disease without esophagitis 530.81 K21.9   3. Anxiety state 300.00 F41.1   4. History of prostate cancer V10.46 Z85.46    Continue current medications as ordered  Follow up in 1 month for CPE. Will check fasting labs at appt - cbc w diff, cmp, lipid panel, tsh, psa  Eydan Chianese S. Perlie Gold  [facility] [address] [place], [place] [address] [phone] Cell (Monday-Friday 8 AM - 5 PM) [phone] After 5 PM and follow prompts

## 2017-04-18 ENCOUNTER — Other Ambulatory Visit: Payer: Self-pay | Admitting: *Deleted

## 2017-04-18 MED ORDER — PANTOPRAZOLE SODIUM 40 MG PO TBEC: 40.0000 mg | DELAYED_RELEASE_TABLET | Freq: Every morning | ORAL | 3 refills | Status: DC

## 2017-04-18 NOTE — Telephone Encounter (Signed)
[facility]

## 2017-07-06 ENCOUNTER — Encounter: Payer: Self-pay | Admitting: Internal Medicine

## 2017-07-06 ENCOUNTER — Ambulatory Visit (INDEPENDENT_AMBULATORY_CARE_PROVIDER_SITE_OTHER): Payer: BLUE CROSS/BLUE SHIELD | Admitting: Internal Medicine

## 2017-07-06 VITALS — BP 110/62 | HR 57 | Temp 98.4°F | Ht 67.0 in | Wt 168.4 lb

## 2017-07-06 DIAGNOSIS — F411 Generalized anxiety disorder: Secondary | ICD-10-CM | POA: Diagnosis not present

## 2017-07-06 DIAGNOSIS — K219 Gastro-esophageal reflux disease without esophagitis: Secondary | ICD-10-CM

## 2017-07-06 DIAGNOSIS — J3089 Other allergic rhinitis: Secondary | ICD-10-CM

## 2017-07-06 DIAGNOSIS — Z0000 Encounter for general adult medical examination without abnormal findings: Secondary | ICD-10-CM

## 2017-07-06 DIAGNOSIS — Z8546 Personal history of malignant neoplasm of prostate: Secondary | ICD-10-CM | POA: Diagnosis not present

## 2017-07-06 LAB — CBC WITH DIFFERENTIAL/PLATELET
BASOS PCT: 0 %
Basophils Absolute: 0 cells/uL (ref 0–200)
EOS PCT: 2 %
Eosinophils Absolute: 80 cells/uL (ref 15–500)
HEMATOCRIT: 43.8 % (ref 38.5–50.0)
HEMOGLOBIN: 14.8 g/dL (ref 13.2–17.1)
LYMPHS ABS: 1640 {cells}/uL (ref 850–3900)
Lymphocytes Relative: 41 %
MCH: 32.2 pg (ref 27.0–33.0)
MCHC: 33.8 g/dL (ref 32.0–36.0)
MCV: 95.4 fL (ref 80.0–100.0)
MPV: 10.3 fL (ref 7.5–12.5)
Monocytes Absolute: 320 cells/uL (ref 200–950)
Monocytes Relative: 8 %
NEUTROS PCT: 49 %
Neutro Abs: 1960 cells/uL (ref 1500–7800)
Platelets: 207 10*3/uL (ref 140–400)
RBC: 4.59 MIL/uL (ref 4.20–5.80)
RDW: 13.7 % (ref 11.0–15.0)
WBC: 4 10*3/uL (ref 3.8–10.8)

## 2017-07-06 LAB — COMPLETE METABOLIC PANEL WITH GFR
ALT: 17 U/L (ref 9–46)
AST: 21 U/L (ref 10–35)
Albumin: 4.6 g/dL (ref 3.6–5.1)
Alkaline Phosphatase: 53 U/L (ref 40–115)
BUN: 12 mg/dL (ref 7–25)
CALCIUM: 9.2 mg/dL (ref 8.6–10.3)
CHLORIDE: 105 mmol/L (ref 98–110)
CO2: 24 mmol/L (ref 20–32)
Creat: 0.98 mg/dL (ref 0.70–1.25)
GFR, EST NON AFRICAN AMERICAN: 81 mL/min (ref >=60)
Glucose, Bld: 105 mg/dL — ABNORMAL HIGH (ref 65–99)
POTASSIUM: 4.1 mmol/L (ref 3.5–5.3)
Sodium: 139 mmol/L (ref 135–146)
Total Bilirubin: 0.7 mg/dL (ref 0.2–1.2)
Total Protein: 6.8 g/dL (ref 6.1–8.1)

## 2017-07-06 LAB — URINALYSIS, ROUTINE W REFLEX MICROSCOPIC
Bilirubin Urine: NEGATIVE
GLUCOSE, UA: NEGATIVE
HGB URINE DIPSTICK: NEGATIVE
KETONES UR: NEGATIVE
Leukocytes, UA: NEGATIVE
Nitrite: NEGATIVE
PH: 7.5 (ref 5.0–8.0)
Protein, ur: NEGATIVE
SPECIFIC GRAVITY, URINE: 1.009 (ref 1.001–1.035)

## 2017-07-06 LAB — LIPID PANEL
CHOL/HDL RATIO: 3.1 ratio (ref <5.0)
CHOLESTEROL: 200 mg/dL — AB (ref <200)
HDL: 64 mg/dL (ref >40)
LDL CALC: 122 mg/dL — AB (ref <100)
TRIGLYCERIDES: 70 mg/dL (ref <150)
VLDL: 14 mg/dL (ref <30)

## 2017-07-06 MED ORDER — ALPRAZOLAM 0.25 MG PO TABS: 0.2500 mg | ORAL_TABLET | ORAL | 1 refills | Status: DC | PRN

## 2017-07-06 MED ORDER — TETANUS-DIPHTH-ACELL PERTUSSIS 5-2.5-18.5 LF-MCG/0.5 IM SUSP: 0.5000 mL | Freq: Once | INTRAMUSCULAR | 0 refills | Status: AC

## 2017-07-06 NOTE — Progress Notes (Signed)
Patient ID: Joseph Hudson, male   DOB: 26-Jan-1951, 66 y.o.   MRN: [id number]   Location:  [facility]  Place of Service:  OFFICE  Provider: Arletha Grippe, DO  Patient Care Team: Gildardo Cranker, DO as PCP - General (Internal Medicine)  Extended Emergency Contact Information Primary Emergency Contact: [facility] Address: [address]          [place], [place] [address] Johnnette Litter of [place] Phone: [phone] Mobile Phone: [phone] Relation: Spouse  Code Status: FULL CODE Goals of Care: Advanced Directive information Advanced Directives 04/01/2017  Does Patient Have a Medical Advance Directive? No  Type of Advance Directive -  Copy of [place] in Chart? -  Would patient like information on creating a medical advance directive? No - Patient declined  Pre-existing out of facility DNR order (yellow form or pink MOST form) -     Chief Complaint  Patient presents with  . Annual Exam    Yearly check up, EKG   . Immunizations    Patient thinks TD vaccine is UTD (need to check prrevious records), will get flu vaccine this weekend at pharmacy   . Medication Refill    Refill Xanax     HPI: Patient is a 66 y.o. male seen in today for an annual wellness exam.    Anxiety - contolled on prn alprazolam  Hx HSV - stress induced. takes prn acyclovir for cold sores. He has sx's 1-2 times per yr  Hx prostate CA (dx in 2012) - was mx by Dr Alinda Money with [facility]. He is s/p radical prostetectomy with 5/12 LN (+) as tumor was found on outside of prostate.  He has stress incontinence with min leakage. Last PSA < 0 about 2 yr ago. He takes prn viagra but states it does not help all the time but does work some time. He saw no benefit from cialis. No new sx's.   GERD - stable on protonix. He has intermittent flatulence but stable on prn gas x  OA - occasional right hip pain. He is followed by Ortho Dr Percell Miller. He had right rotator cuff repair some time ago  also  Hearing loss in both ears - wears hearing aids. He has right tinnitus. No d/c  Allergic rhintis - suboptimally controlled on astelin, flonase, mucinex, sudafed, cetirizine. He has seen Dr Laurance Flatten ENT in the past    Depression screen [facility] 2/9 07/06/2017 04/01/2017  Decreased Interest 0 0  Down, Depressed, Hopeless 0 0  PHQ - 2 Score 0 0    Fall Risk  07/06/2017 04/01/2017  Falls in the past year? No No   No flowsheet data found.   Health Maintenance  Topic Date Due  . TETANUS/TDAP  09/26/1971  . INFLUENZA VACCINE  06/08/2017  . Hepatitis C Screening  11/08/2018 (Originally 1951/04/24)  . HIV Screening  11/08/2018 (Originally 09/26/1967)  . COLONOSCOPY  11/26/2025    Urinary incontinence? occasional  Exercise? He has a regular exercise routine  Diet? Maintains healthy food choices  Hearing: he has hearing loss and wears hearing aids    Dentition: he sees dentist on regular basis  Pain: stable  Past Medical History:  Diagnosis Date  . Adenoma 2011   repeat 2016  . Anxiety   . Cervical radiculopathy   . Depression   . ED (erectile dysfunction)   . GERD (gastroesophageal reflux disease)   . Polyp of colon 2005, results benign  . Prostate cancer ([place]) 08/13/11  dx Adenocarcinom,gleason=3+3=6,PSA=5.08,(5/12)nodes positive,volume=26.6cc  . Rhinitis   . Tinnitus of right ear   . Tubulovillous adenoma 2005  . Ulcer    hx peptic ulcer    Past Surgical History:  Procedure Laterality Date  . COLONOSCOPY WITH PROPOFOL N/A 11/27/2015   Procedure: COLONOSCOPY WITH PROPOFOL;  Surgeon: Garlan Fair, MD;  Location: [facility];  Service: Endoscopy;  Laterality: N/A;  . lipoma removed  5-6 yrs ago   x5 sites- on body  . ORIF left arm fracture Left   . right rotator cuff repair  2008  . ROBOT ASSISTED LAPAROSCOPIC RADICAL PROSTATECTOMY  01/13/2012   Procedure: ROBOTIC ASSISTED LAPAROSCOPIC RADICAL PROSTATECTOMY LEVEL 2;  Surgeon: Dutch Gray, MD;  Location: [facility];   Service: Urology;  Laterality: N/A;  . subcutaneous cysts      Family History  Problem Relation Age of Onset  . Thyroid disease Mother   . Heart attack Mother   . Heart attack Father   . Heart attack Sister   . Lupus Sister   . Autoimmune disease Sister   . Allergic rhinitis Daughter   . Arthritis Daughter   . Autoimmune disease Daughter    Family Status  Relation Status  . Mother Deceased at age 34       endometrial cancer dx 20 years ago, 80 now  . Father Deceased at age 49       heart problems  . Annamarie Major Deceased       prostate ca  . Sister Deceased at age 69  . Brother Alive, age 54y  . Sister Aloha Gell, age 96y  . Sister Alive, age 59y  . Daughter Alive, age 2y  . Daughter Alive, age 38y    shoulder  Social History   Social History  . Marital status: Married    Spouse name: N/A  . Number of children: 2  . Years of education: N/A   Occupational History  .      commercial cabintry here at [employer]   Social History Main Topics  . Smoking status: Former Smoker    Packs/day: 1.50    Years: 25.00    Quit date: 11/09/1983  . Smokeless tobacco: Never Used  . Alcohol use 1.2 oz/week    2 Cans of beer per week     Comment: daily 2-3 12 oz beers  occasional cocktail   . Drug use: No     Comment: ciagrs quit 8 years ago, cigarettes quit 1985  . Sexual activity: Not on file   Other Topics Concern  . Not on file   Social History Narrative  . No narrative on file    No Known Allergies  Allergies as of 07/06/2017   No Known Allergies     Medication List       Accurate as of 07/06/17 10:28 AM. Always use your most recent med list.          acyclovir 400 MG tablet Commonly known as:  ZOVIRAX Take 400 mg by mouth 2 (two) times daily as needed.   ALPRAZolam 0.25 MG tablet Commonly known as:  XANAX Take 1 tablet (0.25 mg total) by mouth as needed for anxiety.   azelastine 0.1 % nasal spray Commonly known as:  ASTELIN Place 2 sprays  into the nose 2 (two) times daily. Use in each nostril as directed   fluticasone 50 MCG/ACT nasal spray Commonly known as:  FLONASE Place 2 sprays into both nostrils daily.   [place]  MAXIMUM STRENGTH 1200 MG Tb12 Generic drug:  Guaifenesin Take 1 tablet by mouth as needed.   MULTIVITAMIN PO Take 1 tablet by mouth daily.   naproxen sodium 220 MG tablet Commonly known as:  ANAPROX Take 220 mg by mouth daily as needed.   pantoprazole 40 MG tablet Commonly known as:  PROTONIX Take 1 tablet (40 mg total) by mouth every morning.   sildenafil 100 MG tablet Commonly known as:  VIAGRA Take 100 mg by mouth daily as needed for erectile dysfunction.   simethicone 125 MG chewable tablet Commonly known as:  MYLICON Chew 812 mg by mouth as needed for flatulence.   SUDAFED 24 HOUR NON-DROWSY 240 MG Tb24 Generic drug:  Pseudoephedrine HCl Take 1 tablet by mouth as needed.   Tdap 5-2.5-18.5 LF-MCG/0.5 injection Commonly known as:  BOOSTRIX Inject 0.5 mLs into the muscle once.   ZYRTEC ALLERGY 10 MG Caps Generic drug:  Cetirizine HCl Take 10 mg by mouth every morning.            Discharge Care Instructions        Start     Ordered   07/06/17 0000  ALPRAZolam (XANAX) 0.25 MG tablet  As needed     07/06/17 1009   07/06/17 0000  Tdap (BOOSTRIX) 5-2.5-18.5 LF-MCG/0.5 injection   Once     07/06/17 1011   07/06/17 0000  EKG 12-Lead     07/06/17 1024       Review of Systems:  Review of Systems  HENT: Positive for postnasal drip.   Eyes: Positive for itching.       Dry eyes  Musculoskeletal: Positive for arthralgias.  Psychiatric/Behavioral: The patient is nervous/anxious.   All other systems reviewed and are negative.   Physical Exam: Vitals:   07/06/17 0942  BP: 110/62  Pulse: (!) 57  Temp: 98.4 F (36.9 C)  TempSrc: Oral  SpO2: 97%  Weight: 168 lb 6.4 oz (76.4 kg)  Height: 5' 7"  (1.702 m)   Body mass index is 26.38 kg/m. Physical Exam  Constitutional: He  is oriented to person, place, and time. He appears well-developed and well-nourished. No distress.  HENT:  Head: Normocephalic and atraumatic.  Right Ear: External ear normal.  Left Ear: External ear normal.  Mouth/Throat: Oropharynx is clear and moist. No oropharyngeal exudate.  MMM; no oral thrush  Eyes: Pupils are equal, round, and reactive to light. EOM are normal. No scleral icterus.  L>R corneal redness but no d/c  Neck: Normal range of motion. Neck supple. Carotid bruit is not present. No tracheal deviation present. No thyromegaly present.  Cardiovascular: Normal rate, regular rhythm and intact distal pulses.  Exam reveals no gallop and no friction rub.   No murmur heard. No LE edema b/l. No calf TTP  Pulmonary/Chest: Effort normal and breath sounds normal. No respiratory distress. He has no wheezes. He has no rales. He exhibits no tenderness.  No rhonchi  Abdominal: Soft. Bowel sounds are normal. He exhibits no distension and no mass. There is no hepatosplenomegaly or hepatomegaly. There is no tenderness. There is no rebound and no guarding. No hernia. Hernia confirmed negative in the right inguinal area and confirmed negative in the left inguinal area.  Genitourinary: Testes normal. Circumcised.  Genitourinary Comments: Rectal/prostate exam deferred 2/2 prostate cancer hx  Musculoskeletal: He exhibits no deformity.  Lymphadenopathy:    He has no cervical adenopathy.       Right: No inguinal adenopathy present.  Left: No inguinal adenopathy present.  Neurological: He is alert and oriented to person, place, and time. He has normal reflexes.  Skin: Skin is warm and dry. No rash noted.  Psychiatric: He has a normal mood and affect. His behavior is normal. Judgment normal.  Vitals reviewed.   Labs reviewed:  Basic Metabolic Panel: No results for input(s): NA, K, CL, CO2, GLUCOSE, BUN, CREATININE, CALCIUM, MG, PHOS, TSH in the last 8760 hours. Liver Function Tests: No  results for input(s): AST, ALT, ALKPHOS, BILITOT, PROT, ALBUMIN in the last 8760 hours. No results for input(s): LIPASE, AMYLASE in the last 8760 hours. No results for input(s): AMMONIA in the last 8760 hours. CBC: No results for input(s): WBC, NEUTROABS, HGB, HCT, MCV, PLT in the last 8760 hours. Lipid Panel: No results for input(s): CHOL, HDL, LDLCALC, TRIG, CHOLHDL, LDLDIRECT in the last 8760 hours. No results found for: HGBA1C  Procedures: No results found. ECG OBTAINED AND REVIEWED BY MYSELF: SB @ 48 bpm, nml axis, LAE, poor R wave progression, IVCD. No acute ischemic changes. No change since 12/2011.  Assessment/Plan   ICD-10-CM   1. Annual physical exam Z00.00 EKG 12-Lead    CBC with Differential/Platelets    CMP with eGFR    Lipid Panel    TSH  2. History of prostate cancer Z85.46 CBC with Differential/Platelets    Urinalysis with Reflex Microscopic    PSA  3. Anxiety state F41.1 TSH  4. Seasonal allergic rhinitis due to other allergic trigger J30.89   5. Gastroesophageal reflux disease without esophagitis K21.9     Continue current medications as ordered - may consider changing to different antihistamine. If no improvement in seasonal allergy sx's, f/u with ENT  Will call with lab results  [place] to get flu shot at local pharmacy. Recommend you wait at least 2 weeks in between shingrix and flu vaccines  Follow up in 1 yr for CPE  Keeping you healthy handout given   Cordella Register. Perlie Gold  [facility] [address] [place], [place] [address] [phone] Cell (Monday-Friday 8 AM - 5 PM) [phone] After 5 PM and follow prompts

## 2017-07-06 NOTE — Patient Instructions (Signed)
Continue current medications as ordered  Will call with lab results  [place] to get flu shot at local pharmacy. Recommend you wait at least 2 weeks in between shingrix and flu vaccines  Follow up in 1 yr for CPE  Keeping you healthy  Get these tests  Blood pressure- Have your blood pressure checked once a year by your healthcare provider.  Normal blood pressure is 120/80  Weight- Have your body mass index (BMI) calculated to screen for obesity.  BMI is a measure of body fat based on height and weight. You can also calculate your own BMI at ViewBanking.si.  Cholesterol- Have your cholesterol checked every year.  Diabetes- Have your blood sugar checked regularly if you have high blood pressure, high cholesterol, have a family history of diabetes or if you are overweight.  Screening for Colon Cancer- Colonoscopy starting at age 56.  Screening may begin sooner depending on your family history and other health conditions. Follow up colonoscopy as directed by your Gastroenterologist.  Screening for Prostate Cancer- Both blood work (PSA) and a rectal exam help screen for Prostate Cancer.  Screening begins at age 35 with African-American men and at age 19 with Caucasian men.  Screening may begin sooner depending on your family history.  Take these medicines  Aspirin- One aspirin daily can help prevent Heart disease and Stroke.  Flu shot- Every fall.  Tetanus- Every 10 years.  Zostavax- Once after the age of 47 to prevent Shingles.  Pneumonia shot- Once after the age of 62; if you are younger than 62, ask your healthcare provider if you need a Pneumonia shot.  Take these steps  Don't smoke- If you do smoke, talk to your doctor about quitting.  For tips on how to quit, go to www.smokefree.gov or call 1-800-QUIT-NOW.  Be physically active- Exercise 5 days a week for at least 30 minutes.  If you are not already physically active start slow and gradually work up to 30 minutes of  moderate physical activity.  Examples of moderate activity include walking briskly, mowing the yard, dancing, swimming, bicycling, etc.  Eat a healthy diet- Eat a variety of healthy food such as fruits, vegetables, low fat milk, low fat cheese, yogurt, lean meant, poultry, fish, beans, tofu, etc. For more information go to www.thenutritionsource.org  Drink alcohol in moderation- Limit alcohol intake to less than two drinks a day. Never drink and drive.  Dentist- Brush and floss twice daily; visit your dentist twice a year.  Depression- Your emotional health is as important as your physical health. If you're feeling down, or losing interest in things you would normally enjoy please talk to your healthcare provider.  Eye exam- Visit your eye doctor every year.  Safe sex- If you may be exposed to a sexually transmitted infection, use a condom.  Seat belts- Seat belts can save your life; always wear one.  Smoke/Carbon Monoxide detectors- These detectors need to be installed on the appropriate level of your home.  Replace batteries at least once a year.  Skin cancer- When out in the sun, cover up and use sunscreen 15 SPF or higher.  Violence- If anyone is threatening you, please tell your healthcare provider.  Living Will/ Health care power of attorney- Speak with your healthcare provider and family.

## 2017-07-07 LAB — PSA: PSA: <0.1 ng/mL (ref <=4.0)

## 2017-07-07 LAB — TSH: TSH: 0.73 mIU/L (ref 0.40–4.50)

## 2017-10-13 ENCOUNTER — Other Ambulatory Visit: Payer: Self-pay | Admitting: *Deleted

## 2017-10-13 MED ORDER — AZELASTINE HCL 0.1 % NA SOLN: 2.0000 | Freq: Two times a day (BID) | NASAL | 3 refills | Status: DC

## 2017-10-13 MED ORDER — FLUTICASONE PROPIONATE 50 MCG/ACT NA SUSP: 2.0000 | Freq: Every day | NASAL | 3 refills | Status: DC

## 2017-10-13 NOTE — Telephone Encounter (Signed)
[facility]

## 2017-11-16 ENCOUNTER — Other Ambulatory Visit: Payer: Self-pay | Admitting: *Deleted

## 2017-11-16 MED ORDER — AZELASTINE HCL 0.1 % NA SOLN: 2.0000 | Freq: Two times a day (BID) | NASAL | 1 refills | Status: DC

## 2017-11-16 MED ORDER — FLUTICASONE PROPIONATE 50 MCG/ACT NA SUSP: 2.0000 | Freq: Every day | NASAL | 1 refills | Status: DC

## 2017-11-16 NOTE — Telephone Encounter (Signed)
Patient called and requested 90 day supply instead of 30 days due to cost.

## 2017-12-03 DIAGNOSIS — J0100 Acute maxillary sinusitis, unspecified: Secondary | ICD-10-CM | POA: Diagnosis not present

## 2018-01-24 DIAGNOSIS — L600 Ingrowing nail: Secondary | ICD-10-CM | POA: Diagnosis not present

## 2018-02-07 ENCOUNTER — Other Ambulatory Visit: Payer: Self-pay | Admitting: *Deleted

## 2018-02-07 MED ORDER — PANTOPRAZOLE SODIUM 40 MG PO TBEC: 40.0000 mg | DELAYED_RELEASE_TABLET | Freq: Every morning | ORAL | 1 refills | Status: DC

## 2018-02-07 NOTE — Telephone Encounter (Signed)
[facility]

## 2018-03-23 ENCOUNTER — Other Ambulatory Visit: Payer: Self-pay | Admitting: *Deleted

## 2018-03-23 MED ORDER — ALPRAZOLAM 0.25 MG PO TABS: 0.2500 mg | ORAL_TABLET | ORAL | 1 refills | Status: AC | PRN

## 2018-03-23 NOTE — Telephone Encounter (Signed)
[facility] [address]

## 2018-03-27 ENCOUNTER — Other Ambulatory Visit: Payer: Self-pay | Admitting: Internal Medicine

## 2018-03-28 NOTE — Telephone Encounter (Signed)
Ok to fill? Last filled 06/2017? Last seen 06/2017, no future appts scheduled.

## 2018-03-28 NOTE — Telephone Encounter (Signed)
A medication refill was received from pharmacy for acyclovir. Rx was pended to provider for approval.

## 2018-05-02 ENCOUNTER — Other Ambulatory Visit: Payer: Self-pay | Admitting: Internal Medicine

## 2018-06-20 ENCOUNTER — Other Ambulatory Visit: Payer: Self-pay | Admitting: *Deleted

## 2018-06-20 ENCOUNTER — Other Ambulatory Visit: Payer: Self-pay | Admitting: Internal Medicine

## 2018-06-20 MED ORDER — AZELASTINE HCL 0.1 % NA SOLN: 2.0000 | Freq: Two times a day (BID) | NASAL | 1 refills | Status: AC

## 2018-06-20 NOTE — Telephone Encounter (Signed)
Patient requested refill

## 2018-06-28 ENCOUNTER — Encounter: Payer: Self-pay | Admitting: Internal Medicine

## 2018-07-14 ENCOUNTER — Other Ambulatory Visit: Payer: Self-pay

## 2018-07-14 MED ORDER — FLUTICASONE PROPIONATE 50 MCG/ACT NA SUSP: 2.0000 | Freq: Every day | NASAL | 0 refills | Status: AC

## 2018-08-17 DIAGNOSIS — Z0000 Encounter for general adult medical examination without abnormal findings: Secondary | ICD-10-CM | POA: Diagnosis not present

## 2019-05-02 DIAGNOSIS — H43813 Vitreous degeneration, bilateral: Secondary | ICD-10-CM | POA: Diagnosis not present

## 2019-05-02 DIAGNOSIS — H5203 Hypermetropia, bilateral: Secondary | ICD-10-CM | POA: Diagnosis not present

## 2019-12-07 ENCOUNTER — Ambulatory Visit: Payer: PRIVATE HEALTH INSURANCE

## 2019-12-11 ENCOUNTER — Other Ambulatory Visit (HOSPITAL_COMMUNITY): Payer: Self-pay | Admitting: Endocrinology

## 2019-12-11 ENCOUNTER — Other Ambulatory Visit: Payer: Self-pay | Admitting: Endocrinology

## 2019-12-11 ENCOUNTER — Other Ambulatory Visit (HOSPITAL_COMMUNITY): Payer: Self-pay | Admitting: Emergency Medicine

## 2019-12-11 ENCOUNTER — Other Ambulatory Visit: Payer: Self-pay | Admitting: Emergency Medicine

## 2019-12-11 DIAGNOSIS — E0581 Other thyrotoxicosis with thyrotoxic crisis or storm: Secondary | ICD-10-CM

## 2019-12-11 DIAGNOSIS — E0590 Thyrotoxicosis, unspecified without thyrotoxic crisis or storm: Secondary | ICD-10-CM

## 2019-12-15 ENCOUNTER — Ambulatory Visit: Payer: Self-pay

## 2019-12-28 ENCOUNTER — Ambulatory Visit: Payer: PRIVATE HEALTH INSURANCE

## 2019-12-31 ENCOUNTER — Encounter (HOSPITAL_COMMUNITY)
Admission: RE | Admit: 2019-12-31 | Discharge: 2019-12-31 | Disposition: A | Discharge status: Home / Self Care | Payer: BC Managed Care – PPO | Source: Ambulatory Visit | Attending: Endocrinology | Admitting: Endocrinology

## 2019-12-31 ENCOUNTER — Other Ambulatory Visit: Payer: Self-pay

## 2019-12-31 DIAGNOSIS — E0590 Thyrotoxicosis, unspecified without thyrotoxic crisis or storm: Secondary | ICD-10-CM | POA: Insufficient documentation

## 2019-12-31 MED ORDER — SODIUM IODIDE I-123 7.4 MBQ CAPS
430.0000 | ORAL_CAPSULE | Freq: Once | ORAL | Status: AC
  Administered 2019-12-31: 10:00:00 430 via ORAL

## 2020-01-01 ENCOUNTER — Encounter (HOSPITAL_COMMUNITY)
Admission: RE | Admit: 2020-01-01 | Discharge: 2020-01-01 | Disposition: A | Discharge status: Home / Self Care | Payer: BC Managed Care – PPO | Source: Ambulatory Visit | Attending: Endocrinology | Admitting: Endocrinology

## 2020-01-08 ENCOUNTER — Other Ambulatory Visit (HOSPITAL_COMMUNITY): Payer: Self-pay | Admitting: Endocrinology

## 2020-01-08 DIAGNOSIS — E0590 Thyrotoxicosis, unspecified without thyrotoxic crisis or storm: Secondary | ICD-10-CM

## 2020-02-04 ENCOUNTER — Inpatient Hospital Stay (HOSPITAL_COMMUNITY): Admission: RE | Admit: 2020-02-04 | Payer: BC Managed Care – PPO | Source: Ambulatory Visit

## 2020-02-07 ENCOUNTER — Encounter (HOSPITAL_COMMUNITY)
Admission: RE | Admit: 2020-02-07 | Discharge: 2020-02-07 | Disposition: A | Discharge status: Home / Self Care | Payer: BC Managed Care – PPO | Source: Ambulatory Visit | Attending: Endocrinology | Admitting: Endocrinology

## 2020-02-07 ENCOUNTER — Other Ambulatory Visit: Payer: Self-pay

## 2020-02-07 DIAGNOSIS — E0590 Thyrotoxicosis, unspecified without thyrotoxic crisis or storm: Secondary | ICD-10-CM | POA: Diagnosis present

## 2020-02-07 MED ORDER — SODIUM IODIDE I 131 CAPSULE
20.0000 | Freq: Once | INTRAVENOUS | Status: AC | PRN
  Administered 2020-02-07: 19.6 via ORAL

## 2021-05-17 IMAGING — NM NM RAI THERAPY FOR HYPERTHYROIDISM
1 series · 1 of 1 positions shown · non-contrast
Comparison: Thyroid uptake and scan 01/01/2020

CLINICAL DATA: Hyperthyroidism

EXAM:
RADIOACTIVE IODINE THERAPY FOR HYPERTHYROIDISM
TECHNIQUE: Radioactive iodine prescribed by myself after reviewing the
patient's prior studies on 01/09/2020. The risks and benefits of
radioactive iodine therapy were discussed with the patient in detail
by Dr. Fx Diffusion. Alternative therapies were also mentioned. Radiation
safety was discussed with the patient, including how to protect the
general public from exposure. There were no barriers to
communication. Written consent was obtained. The patient then
received a capsule containing the radiopharmaceutical.
The patient will follow-up with the referring physician.
RADIOPHARMACEUTICALS:  19.6 mCi 1-HJH sodium iodide orally

[Series 1: 0 min · 4.14mm/px · 1 of 1 slices shown]
[im 1/1]
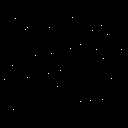

[1 of 1 positions shown; findings below may reference images not displayed]

IMPRESSION: Per oral administration of 1-HJH sodium iodide for the treatment of
hyperthyroidism.
